# Patient Record
Sex: Female | Born: 1950 | Race: White | Hispanic: No | Marital: Married | Smoking: Never smoker
Health system: Southern US, Community
[De-identification: ages and names within clinical notes are randomized; demographics above are authoritative.]

## PROBLEM LIST (undated history)

## (undated) DIAGNOSIS — D510 Vitamin B12 deficiency anemia due to intrinsic factor deficiency: Secondary | ICD-10-CM

## (undated) DIAGNOSIS — G4730 Sleep apnea, unspecified: Secondary | ICD-10-CM

## (undated) DIAGNOSIS — Z9889 Other specified postprocedural states: Secondary | ICD-10-CM

## (undated) DIAGNOSIS — I00 Rheumatic fever without heart involvement: Secondary | ICD-10-CM

## (undated) DIAGNOSIS — E039 Hypothyroidism, unspecified: Secondary | ICD-10-CM

## (undated) DIAGNOSIS — C44712 Basal cell carcinoma of skin of right lower limb, including hip: Secondary | ICD-10-CM

## (undated) DIAGNOSIS — N8501 Benign endometrial hyperplasia: Secondary | ICD-10-CM

## (undated) DIAGNOSIS — I341 Nonrheumatic mitral (valve) prolapse: Secondary | ICD-10-CM

## (undated) DIAGNOSIS — I340 Nonrheumatic mitral (valve) insufficiency: Secondary | ICD-10-CM

## (undated) DIAGNOSIS — K589 Irritable bowel syndrome without diarrhea: Secondary | ICD-10-CM

## (undated) DIAGNOSIS — N6019 Diffuse cystic mastopathy of unspecified breast: Secondary | ICD-10-CM

## (undated) DIAGNOSIS — R0600 Dyspnea, unspecified: Secondary | ICD-10-CM

## (undated) HISTORY — DX: Vitamin B12 deficiency anemia due to intrinsic factor deficiency: D51.0

## (undated) HISTORY — DX: Hypothyroidism, unspecified: E03.9

## (undated) HISTORY — DX: Nonrheumatic mitral (valve) insufficiency: I34.0

## (undated) HISTORY — DX: Benign endometrial hyperplasia: N85.01

## (undated) HISTORY — PX: APPENDECTOMY: SHX54

## (undated) HISTORY — DX: Basal cell carcinoma of skin of right lower limb, including hip: C44.712

## (undated) HISTORY — PX: TONSILLECTOMY AND ADENOIDECTOMY: SUR1326

## (undated) HISTORY — DX: Irritable bowel syndrome, unspecified: K58.9

## (undated) HISTORY — DX: Nonrheumatic mitral (valve) prolapse: I34.1

## (undated) HISTORY — DX: Diffuse cystic mastopathy of unspecified breast: N60.19

---

## 1997-12-09 ENCOUNTER — Ambulatory Visit (HOSPITAL_COMMUNITY): Admission: RE | Admit: 1997-12-09 | Discharge: 1997-12-09 | Payer: Self-pay | Admitting: *Deleted

## 1997-12-16 ENCOUNTER — Ambulatory Visit (HOSPITAL_COMMUNITY): Admission: RE | Admit: 1997-12-16 | Discharge: 1997-12-16 | Payer: Self-pay | Admitting: *Deleted

## 1998-08-05 ENCOUNTER — Ambulatory Visit (HOSPITAL_COMMUNITY): Admission: RE | Admit: 1998-08-05 | Discharge: 1998-08-05 | Payer: Self-pay | Admitting: *Deleted

## 1998-08-05 ENCOUNTER — Encounter: Payer: Self-pay | Admitting: *Deleted

## 1998-10-08 ENCOUNTER — Other Ambulatory Visit: Admission: RE | Admit: 1998-10-08 | Discharge: 1998-10-08 | Payer: Self-pay | Admitting: *Deleted

## 1998-10-09 ENCOUNTER — Other Ambulatory Visit: Admission: RE | Admit: 1998-10-09 | Discharge: 1998-10-09 | Payer: Self-pay | Admitting: *Deleted

## 2000-01-25 ENCOUNTER — Other Ambulatory Visit: Admission: RE | Admit: 2000-01-25 | Discharge: 2000-01-25 | Payer: Self-pay | Admitting: Obstetrics and Gynecology

## 2000-09-29 ENCOUNTER — Other Ambulatory Visit: Admission: RE | Admit: 2000-09-29 | Discharge: 2000-09-29 | Payer: Self-pay | Admitting: Obstetrics and Gynecology

## 2000-10-21 ENCOUNTER — Encounter: Admission: RE | Admit: 2000-10-21 | Discharge: 2000-10-21 | Payer: Self-pay | Admitting: Obstetrics and Gynecology

## 2000-10-21 ENCOUNTER — Encounter: Payer: Self-pay | Admitting: Obstetrics and Gynecology

## 2001-01-24 ENCOUNTER — Other Ambulatory Visit: Admission: RE | Admit: 2001-01-24 | Discharge: 2001-01-24 | Payer: Self-pay | Admitting: Obstetrics and Gynecology

## 2001-04-07 ENCOUNTER — Ambulatory Visit (HOSPITAL_COMMUNITY): Admission: RE | Admit: 2001-04-07 | Discharge: 2001-04-07 | Payer: Self-pay | Admitting: Gastroenterology

## 2001-04-07 ENCOUNTER — Encounter: Payer: Self-pay | Admitting: Gastroenterology

## 2001-04-14 ENCOUNTER — Encounter: Admission: RE | Admit: 2001-04-14 | Discharge: 2001-04-14 | Payer: Self-pay | Admitting: Gastroenterology

## 2001-04-14 ENCOUNTER — Encounter: Payer: Self-pay | Admitting: Gastroenterology

## 2001-04-22 ENCOUNTER — Emergency Department (HOSPITAL_COMMUNITY): Admission: EM | Admit: 2001-04-22 | Discharge: 2001-04-22 | Payer: Self-pay | Admitting: Emergency Medicine

## 2001-05-10 ENCOUNTER — Ambulatory Visit (HOSPITAL_COMMUNITY): Admission: RE | Admit: 2001-05-10 | Discharge: 2001-05-10 | Payer: Self-pay | Admitting: Gastroenterology

## 2002-02-14 ENCOUNTER — Other Ambulatory Visit: Admission: RE | Admit: 2002-02-14 | Discharge: 2002-02-14 | Payer: Self-pay | Admitting: Obstetrics and Gynecology

## 2002-02-27 ENCOUNTER — Other Ambulatory Visit: Admission: RE | Admit: 2002-02-27 | Discharge: 2002-02-27 | Payer: Self-pay | Admitting: Obstetrics and Gynecology

## 2002-03-08 ENCOUNTER — Ambulatory Visit (HOSPITAL_COMMUNITY): Admission: RE | Admit: 2002-03-08 | Discharge: 2002-03-08 | Payer: Self-pay | Admitting: Gastroenterology

## 2002-03-17 ENCOUNTER — Inpatient Hospital Stay (HOSPITAL_COMMUNITY): Admission: EM | Admit: 2002-03-17 | Discharge: 2002-03-18 | Payer: Self-pay | Admitting: Emergency Medicine

## 2002-03-17 ENCOUNTER — Encounter (INDEPENDENT_AMBULATORY_CARE_PROVIDER_SITE_OTHER): Payer: Self-pay | Admitting: Specialist

## 2002-03-18 HISTORY — PX: LAPAROSCOPIC CHOLECYSTECTOMY: SUR755

## 2002-06-20 ENCOUNTER — Other Ambulatory Visit: Admission: RE | Admit: 2002-06-20 | Discharge: 2002-06-20 | Payer: Self-pay | Admitting: Obstetrics and Gynecology

## 2003-03-06 ENCOUNTER — Other Ambulatory Visit: Admission: RE | Admit: 2003-03-06 | Discharge: 2003-03-06 | Payer: Self-pay | Admitting: Obstetrics and Gynecology

## 2003-06-26 ENCOUNTER — Emergency Department (HOSPITAL_COMMUNITY): Admission: EM | Admit: 2003-06-26 | Discharge: 2003-06-26 | Payer: Self-pay | Admitting: Emergency Medicine

## 2003-06-26 ENCOUNTER — Encounter: Payer: Self-pay | Admitting: Emergency Medicine

## 2004-02-18 ENCOUNTER — Encounter: Admission: RE | Admit: 2004-02-18 | Discharge: 2004-02-18 | Payer: Self-pay | Admitting: Obstetrics and Gynecology

## 2004-03-11 ENCOUNTER — Other Ambulatory Visit: Admission: RE | Admit: 2004-03-11 | Discharge: 2004-03-11 | Payer: Self-pay | Admitting: Obstetrics and Gynecology

## 2005-03-17 ENCOUNTER — Other Ambulatory Visit: Admission: RE | Admit: 2005-03-17 | Discharge: 2005-03-17 | Payer: Self-pay | Admitting: Obstetrics and Gynecology

## 2005-03-24 ENCOUNTER — Encounter: Admission: RE | Admit: 2005-03-24 | Discharge: 2005-03-24 | Payer: Self-pay | Admitting: Obstetrics and Gynecology

## 2006-04-18 ENCOUNTER — Other Ambulatory Visit: Admission: RE | Admit: 2006-04-18 | Discharge: 2006-04-18 | Payer: Self-pay | Admitting: Obstetrics and Gynecology

## 2006-05-20 ENCOUNTER — Encounter: Admission: RE | Admit: 2006-05-20 | Discharge: 2006-05-20 | Payer: Self-pay | Admitting: Obstetrics and Gynecology

## 2007-08-02 ENCOUNTER — Other Ambulatory Visit: Admission: RE | Admit: 2007-08-02 | Discharge: 2007-08-02 | Payer: Self-pay | Admitting: Obstetrics & Gynecology

## 2007-08-04 ENCOUNTER — Encounter: Admission: RE | Admit: 2007-08-04 | Discharge: 2007-08-04 | Payer: Self-pay | Admitting: Obstetrics and Gynecology

## 2008-06-20 ENCOUNTER — Encounter: Admission: RE | Admit: 2008-06-20 | Discharge: 2008-06-20 | Payer: Self-pay | Admitting: Obstetrics and Gynecology

## 2008-08-06 ENCOUNTER — Other Ambulatory Visit: Admission: RE | Admit: 2008-08-06 | Discharge: 2008-08-06 | Payer: Self-pay | Admitting: Obstetrics and Gynecology

## 2011-03-05 NOTE — Procedures (Signed)
. [facility]  Patient:    Tammy Montoya, HOLECEK Visit Number: [id number] MRN: [id number]          Service Type: END Location: ENDO Attending Physician:  Charna Elizabeth Dictated by:   Anselmo Rod, M.D. Proc. Date: 03/08/02 Admit Date:  03/08/2002 Discharge Date: 03/08/2002   CC:         Aram Beecham P. Ashley Royalty, M.D.  Abigail Miyamoto, M.D.   Procedure Report  DATE OF BIRTH:  Jul 08, 1951  REFERRING PHYSICIAN:  Edwena Felty. Ashley Royalty, M.D.  PROCEDURE PERFORMED:  Esophagogastroduodenoscopy.  ENDOSCOPIST:  Anselmo Rod, M.D.  INSTRUMENT USED:  Olympus video panendoscope.  INDICATIONS FOR PROCEDURE:  Ongoing abdominal pain mostly in the epigastric and right upper quadrant.  Rule out peptic ulcer disease, esophagitis, gastritis, etc.  PREPROCEDURE PREPARATION:  Informed consent was procured from the patient. The patient was fasted for eight hours prior to the procedure.  PREPROCEDURE PHYSICAL:  The patient had stable vital signs.  Neck supple. Chest clear to auscultation.  S1, S2 regular.  Abdomen soft with normal abdominal bowel sounds.  DESCRIPTION OF PROCEDURE:  The patient was placed in left lateral decubitus position and sedated with 50 mg of Demerol and 5 mg of Versed intravenously. Once the patient was adequately sedated and maintained on low-flow oxygen and continuous cardiac monitoring, the Olympus video panendoscope was advanced through the mouthpiece, over the tongue, into the esophagus under direct vision.  Except for a small hiatal hernia, no other abnormalities were noted. There were no ulcers, erosions, masses or polyps present.  IMPRESSION:  Normal esophagogastroduodenoscopy except for a small hiatal hernia.  RECOMMENDATION: 1. Continue proton pump inhibitors. 2. Follow antireflux measures. 3. Avoid all nonsteroidals including aspirin. 4. Surgical evaluation with Dr. Abigail Miyamoto for possible laparoscopic  cholecystectomy. Dictated by:   Anselmo Rod, M.D. Attending Physician:  Charna Elizabeth DD:  03/11/02 TD:  03/13/02 Job: [id number]

## 2011-03-05 NOTE — Procedures (Signed)
[place]  Patient:    Tammy Montoya, Tammy Montoya                      MRN: [id number] Proc. Date: 05/10/01 Adm. Date:  [id number] Disc. Date: [id number] Attending:  Benny Lennert CC:         Edwena Felty. Ashley Royalty, M.D.   Procedure Report  DATE OF BIRTH:  [birth year]  REFERRING PHYSICIAN:  Edwena Felty. Ashley Royalty, M.D.  PROCEDURE PERFORMED:  Colonoscopy.  ENDOSCOPIST:  Anselmo Rod, M.D.  INSTRUMENT USED:  Olympus pediatric video colonoscope.  INDICATIONS FOR PROCEDURE:  Guaiac positive stool and abdominal pain in a 60 year old white female.  Rule out colonic polyps, masses, hemorrhoids, etc.  PREPROCEDURE PREPARATION:  Informed consent was procured from the patient. The patient was fasted for eight hours prior to the procedure and prepped with a bottle of magnesium citrate and a gallon of NuLytely the night prior to the procedure.  PREPROCEDURE PHYSICAL:  The patient had stable vital signs.  Neck supple. Chest clear to auscultation.  S1, S2 regular.  Abdomen soft with normal bowel sounds.  DESCRIPTION OF PROCEDURE:  The patient was placed in the left lateral decubitus position and sedated with 50 mg of Demerol and 5 mg of Versed intravenously.  Once the patient was adequately sedated and maintained on low-flow oxygen and continuous cardiac monitoring, the Olympus video colonoscope was advanced from the rectum to the cecum with slight difficulty secondary to a large amount of residual stool in the colon.  No masses, polyps, erosions, ulcerations or diverticula were seen.  IMPRESSION:  Normal colonoscopy except for some residual stool in the colon. Small lesions could have been missed.  RECOMMENDATIONS:  Repeat guaiac testing will be done on an outpatient basis and further recommendations made thereafter.DD:  05/10/01 TD:  05/10/01 Job: [id number]

## 2011-03-05 NOTE — H&P (Signed)
[facility]  Patient:    Tammy Montoya, Tammy Montoya Visit Number: [id number] MRN: [id number]          Service Type: END Location: ENDO Attending Physician:  Charna Elizabeth Dictated by:   Currie Paris, M.D. Admit Date:  03/08/2002 Discharge Date: 03/08/2002   CC:         Anselmo Rod, M.D.   History and Physical  VISIT # [id number]  CHIEF COMPLAINT:  Abdominal pain.  HISTORY OF PRESENT ILLNESS:  The patient is a 60 year old lady who has been worked up over the last year by Dr. Elsie Amis for intermittent problems with right upper quadrant pain and nausea.  She has actually had ongoing symptoms for about five years.  No definite etiology of her pain has been found, but it has been thought that it was biliary despite a negative ultrasound and normal emptying scan.  She had a normal endoscopy about 10 days ago and saw Dr. Carman Ching of our office about five days ago and was scheduled for elective laparoscopic cholecystectomy on March 29, 2002.  However, since she was seen in our office on Tuesday, she has had increasing problems with nausea such that for the last 24-36 hours she has not been able to keep anything on her stomach and has increasing right upper quadrant soreness.  She has not had any fever, chills, bowel habit changes, etc.  Because of her symptoms she has presented to the emergency room for further evaluation.  PAST MEDICAL HISTORY:  Generally good health. 1. Mitral valve prolapse. 2. Some history of GERD.  ALLERGIES:  She apparently is allergic to CT CONTRAST.  MEDICATIONS:  She has been taking Synthroid for hypothyroidism as well as Nexium for her GI symptoms, the Nexium of which has not helped.  REVIEW OF SYSTEMS:  Basically unremarkable except for a history of endometriosis in the past, but nothing to suggest any symptoms from that.  PAST SURGICAL HISTORY: 1. Appendectomy at age 60. 2. About 15 years ago, laparoscopy for  fertility surgery which worked, as they    have a single child, age 7.  PHYSICAL EXAMINATION:  GENERAL:  Generally healthy-appearing female.  VITAL SIGNS:  Noted in the chart and unremarkable.  HEENT:  Head normocephalic.  Eyes nonicteric.  Pupils round and regular.  NECK:  Supple.  No masses or thyromegaly.  LUNGS:  Clear to auscultation.  HEART:  Regular.  No murmurs, rubs, or gallops.  ABDOMEN:  Generally soft, but she has some definite tenderness in the right upper quadrant.  She has a right lower quadrant scar that is well-healed, as well as an umbilical scar.  There is no guarding, no rebound.  Bowel sounds are normal.  EXTREMITIES:  No cyanosis or edema.  LABORATORY DATA:  CBC, CMET, UA, amylase, and lipase are normal.  IMPRESSION:  Biliary symptoms in a patient without clear-cut gallstones.  RECOMMENDATIONS:  Discussed at length with the patient her symptoms.  She has had a pretty thorough workup and was already committed to proceeding to cholecystectomy.  She understands from Dr. Magnus Ivan, and I reinforced, that given the negative workup we cannot be certain at all that her symptoms will resolve with cholecystectomy, and she does understand there are risks to the procedure as well as not getting improvement from her symptoms.  I did tell her I thought there was a 60-80% chance that she would improve, but that did leave a significant rate of nonimprovement.  I offered her alternatives to  proceeded with surgery this weekend versus being treated medically with some antinausea medications and liquids and try to wait until next week or move her elective surgery up a little bit.  The patient feels that since she has been sick for the last two days, not able to keep anything down, and she is already committed to proceeding to a cholecystectomy, she would like to go ahead and proceed.  I will, therefore, admit her, place her on IV fluids tonight, and try to get her on  the schedule for Sunday, March 18, 2002. Dictated by:   Currie Paris, M.D. Attending Physician:  Charna Elizabeth DD:  03/17/02 TD:  03/18/02 Job: [id number]

## 2011-03-05 NOTE — Op Note (Signed)
[facility]  Patient:    Tammy Montoya, Tammy Montoya Visit Number: [id number] MRN: [id number]          Service Type: MED Location: 4N [id number] Attending Physician:  Charlton Haws Dictated by:   Currie Paris, M.D. Proc. Date: 03/18/02 Admit Date:  03/17/2002                             Operative Report  VISIT NUMBER:  [id number]  PREOPERATIVE DIAGNOSIS:  Chronic cholecystitis.  POSTOPERATIVE DIAGNOSIS:  Chronic cholecystitis.  OPERATION:  Laparoscopic cholecystectomy.  SURGEON:  Currie Paris, M.D.  ASSISTANT:  Thornton Park. Daphine Deutscher, M.D.  ANESTHESIA:  General endotracheal.  CLINICAL HISTORY:  This patient is a 60 year old woman, who has been having intermittent problems with right upper quadrant pain.  Work-up had been negative, but it was thought to be biliary, and she was actually scheduled for an elective laparoscopic cholecystectomy about two weeks from now but presented to the emergency room yesterday with increasing abdominal pain, nausea, had not really been able to keep anything down on her stomach for a couple of days, and we elected at this point, after a full discussion of alternatives and possibilities of continuing to have symptoms, to proceed to surgery.  DESCRIPTION OF PROCEDURE:  The patient was seen in the holding area and had no further questions.  She was taken to the operating room.  After satisfactory general endotracheal anesthesia had been obtained, the abdomen was prepped and draped.  I used 0.25% Marcaine for each incision.  The umbilical incision was made first, the fascia opened, and the peritoneal cavity entered under direct vision.  A pursestring was placed and the Hasson introduced and the abdomen insufflated to 15.  With the camera in place, I perused the abdomen and saw no gross abnormalities, but I was not able to see the pelvic organs well.  The gallbladder was nondistended but whitish looking.  The  liver looked normal. There were omental adhesions all the way of the entire length of the gallbladder, indicating some chronic disease here.  The gallbladder was retracted over the liver, and these adhesions were taken down using cautery and occasionally a clip on some of the omental vessels until we got down to the neck of the gallbladder and around the area of the triangle of Calot.  The cystic duct was identified as was the cystic artery, and they were dissected out and a window opened so I could well into the triangle of Calot and make sure the anatomy was properly identified.  The cystic duct was clipped with three clips on the stay side and one on the gallbladder side and divided, and the cystic artery was divided with two clips on the stay side and one on the go side.  The gallbladder was then removed from below to above with coagulation and current of the cautery.  Just prior to disconnecting it from the bed of the liver, we irrigated and made sure everything was dry.  The gallbladder was brought out the umbilical port, then reinsufflated and a final check for hemostasis made and, again, everything appeared to be dry.  The lateral ports were then removed, and there was no bleeding.  The umbilical port was removed and the pursestring tied down.  The abdomen was deflated through the epigastric port.  The skin was closed with 4-0 Monocryl subcuticular plus Steri-Strips.  The patient tolerated the procedure well.  There were no operative complications.  All counts were correct. Dictated by:   Currie Paris, M.D. Attending Physician:  Charlton Haws DD:  03/18/02 TD:  03/19/02 Job: [phone]

## 2013-09-05 ENCOUNTER — Other Ambulatory Visit: Payer: Self-pay | Admitting: Chiropractic Medicine

## 2013-09-05 ENCOUNTER — Ambulatory Visit
Admission: RE | Admit: 2013-09-05 | Discharge: 2013-09-05 | Disposition: A | Discharge status: Home / Self Care | Payer: BC Managed Care – PPO | Source: Ambulatory Visit | Attending: Chiropractic Medicine | Admitting: Chiropractic Medicine

## 2013-09-05 DIAGNOSIS — M2170 Unequal limb length (acquired), unspecified site: Secondary | ICD-10-CM

## 2013-09-05 DIAGNOSIS — M5412 Radiculopathy, cervical region: Secondary | ICD-10-CM

## 2013-09-05 DIAGNOSIS — M5416 Radiculopathy, lumbar region: Secondary | ICD-10-CM

## 2013-09-05 DIAGNOSIS — M545 Low back pain: Secondary | ICD-10-CM

## 2013-11-20 ENCOUNTER — Encounter: Payer: Self-pay | Admitting: Obstetrics and Gynecology

## 2013-11-23 ENCOUNTER — Encounter: Payer: Self-pay | Admitting: Gynecology

## 2013-11-23 ENCOUNTER — Ambulatory Visit (INDEPENDENT_AMBULATORY_CARE_PROVIDER_SITE_OTHER): Payer: BC Managed Care – PPO | Admitting: Gynecology

## 2013-11-23 ENCOUNTER — Ambulatory Visit: Payer: Self-pay | Admitting: Obstetrics and Gynecology

## 2013-11-23 VITALS — BP 96/71 | HR 70 | Resp 18 | Ht 69.0 in | Wt 149.0 lb

## 2013-11-23 DIAGNOSIS — N6320 Unspecified lump in the left breast, unspecified quadrant: Secondary | ICD-10-CM

## 2013-11-23 DIAGNOSIS — Z79890 Hormone replacement therapy (postmenopausal): Secondary | ICD-10-CM

## 2013-11-23 DIAGNOSIS — N6019 Diffuse cystic mastopathy of unspecified breast: Secondary | ICD-10-CM

## 2013-11-23 DIAGNOSIS — Z01419 Encounter for gynecological examination (general) (routine) without abnormal findings: Secondary | ICD-10-CM

## 2013-11-23 DIAGNOSIS — N63 Unspecified lump in unspecified breast: Secondary | ICD-10-CM

## 2013-11-23 DIAGNOSIS — D510 Vitamin B12 deficiency anemia due to intrinsic factor deficiency: Secondary | ICD-10-CM | POA: Insufficient documentation

## 2013-11-23 MED ORDER — ESTRADIOL 0.05 MG/24HR TD PTWK: 0.0500 mg | MEDICATED_PATCH | TRANSDERMAL | Status: DC

## 2013-11-23 MED ORDER — PROGESTERONE MICRONIZED 200 MG PO CAPS: 200.0000 mg | ORAL_CAPSULE | Freq: Every day | ORAL | Status: DC

## 2013-11-23 NOTE — Addendum Note (Signed)
Addended by: Michele Mcalpine on: 11/23/2013 04:26 PM   Modules accepted: Orders

## 2013-11-23 NOTE — Patient Instructions (Signed)

## 2013-11-23 NOTE — Progress Notes (Signed)
63 y.o. Married Caucasian female   [id number] here for annual exam. Pt reports menses are absent. She does not report hot flashes, does not have night sweats, does not have vaginal dryness.  She is not using lubricants.  She does not report post-menopasual bleeding.  Pt is happy with HRT and would like to continue was started for both bleeding and mood swings.  Pt states she never had hot flashes.  Patient's last menstrual period was 09/17/2012.          Sexually active: yes  The current method of family planning is post menopausal status.    Exercising: no  The patient does not participate in regular exercise at present. Last pap: 11/17/11 NEG HR HPV Abnormal PAP: No Mammogram: 06/20/2008 ; 2013  BSE: has been 3 months Colonoscopy: 07/2012 Polyps f/u in 10 years  DEXA: 03/2005 Alcohol: no Tobacco: no  Labs: [place] Fulbright,MD ; Urine:   Health Maintenance  Topic Date Due  . Mammogram  06/20/2010  . Zostavax  07/25/2011  . Influenza Vaccine  05/18/2013  . Pap Smear  11/16/2014  . Tetanus/tdap  02/15/2017  . Colonoscopy  07/18/2022    Family History  Problem Relation Age of Onset  . Osteoporosis Mother   . Diabetes Father   . Heart attack Father   . Diabetes Brother   . Diabetes Paternal Grandmother     There are no active problems to display for this patient.   Past Medical History  Diagnosis Date  . Hypothyroid   . IBS (irritable bowel syndrome)   . Fibrocystic disease of breast   . Basal cell carcinoma of right lower leg   . MVP (mitral valve prolapse)     Murmur  . Simple endometrial hyperplasia without atypia     Past Surgical History  Procedure Laterality Date  . Appendectomy    . Tonsillectomy and adenoidectomy    . Laparoscopic cholecystectomy  03/2002    Allergies: Review of patient's allergies indicates no known allergies.  Current Outpatient Prescriptions  Medication Sig Dispense Refill  . Ascorbic Acid (VITAMIN C PO) Take by mouth.      . Calcium  Citrate (CITRACAL PO) Take by mouth.      . Cholecalciferol (VITAMIN D) 2000 UNITS CAPS Take by mouth.      . estazolam (PROSOM) 2 MG tablet Take 2 mg by mouth at bedtime.      Marland Kitchen estradiol (CLIMARA - DOSED IN MG/24 HR) 0.05 mg/24hr patch Place 0.05 mg onto the skin once a week.      . levothyroxine (SYNTHROID, LEVOTHROID) 137 MCG tablet Take 137 mcg by mouth daily before breakfast.      . loratadine (CLARITIN) 10 MG tablet Take 10 mg by mouth as needed for allergies.      . Multiple Vitamins-Minerals (MULTIVITAL PO) Take by mouth.      . Naproxen Sodium (ALEVE PO) Take by mouth.      . progesterone (PROMETRIUM) 200 MG capsule Take 200 mg by mouth daily.      . TRAMADOL HCL PO Take by mouth.      Marland Kitchen VITAMIN E PO Take by mouth.       No current facility-administered medications for this visit.    ROS: Pertinent items are noted in HPI.  Exam:    BP 96/71  Pulse 70  Resp 18  Ht 5\' 9"  (1.753 m)  Wt 149 lb (67.586 kg)  BMI 21.99 kg/m2  LMP 09/17/2012 Weight change: @WEIGHTCHANGE @  Last 3 height recordings:  Ht Readings from Last 3 Encounters:  11/23/13 5\' 9"  (1.753 m)   General appearance: alert, cooperative and appears stated age Head: Normocephalic, without obvious abnormality, atraumatic Neck: no adenopathy, no carotid bruit, no JVD, supple, symmetrical, trachea midline and thyroid not enlarged, symmetric, no tenderness/mass/nodules Lungs: clear to auscultation bilaterally Breasts: normal appearance, no masses or tenderness, fibrocystic, area of tenderness closer to 2 o'clock left Heart: regular rate and rhythm, S1, S2 normal, no murmur, click, rub or gallop Abdomen: soft, non-tender; bowel sounds normal; no masses,  no organomegaly Extremities: extremities normal, atraumatic, no cyanosis or edema Skin: Skin color, texture, turgor normal. No rashes or lesions, senile keratosis noted on left breast will f/u, has appt with PCP 2w Lymph nodes: Cervical, supraclavicular, and axillary  nodes normal. no inguinal nodes palpated Neurologic: Grossly normal   Pelvic: External genitalia:  no lesions              Urethra: normal appearing urethra with no masses, tenderness or lesions              Bartholins and Skenes: normal                 Vagina: atrophic              Cervix: normal appearance              Pap taken: no        Bimanual Exam:  Uterus:  uterus is normal size, shape, consistency and nontender                                      Adnexa:    no masses                                      Rectovaginal: Confirms                                      Anus:  normal sphincter tone, no lesions  A: well woman no contraindication to continue hormonal therapy     P: mammogram annual, diagnostic mammo on left pap smear not done Pt to consider stopping HRT and transitioning to effexor-will get back to [place] counseled on breast self exam, mammography screening, osteoporosis, adequate intake of calcium and vitamin D, diet and exercise return annually or prn Discussed PAP guideline changes, importance of weight bearing exercises, calcium, vit D and balanced diet.  An After Visit Summary was printed and given to the patient.

## 2013-11-23 NOTE — Progress Notes (Addendum)
Bilateral Diagnostic Mammogram and L Breast U/S scheduled at [facility] for 11/30/13 at [place].   Patient agreeable to time/date/location. Placed in [place] hold.

## 2013-11-30 ENCOUNTER — Other Ambulatory Visit: Payer: Self-pay | Admitting: Gynecology

## 2013-11-30 ENCOUNTER — Ambulatory Visit
Admission: RE | Admit: 2013-11-30 | Discharge: 2013-11-30 | Disposition: A | Discharge status: Home / Self Care | Payer: Self-pay | Source: Ambulatory Visit | Attending: Gynecology | Admitting: Gynecology

## 2013-11-30 ENCOUNTER — Ambulatory Visit
Admission: RE | Admit: 2013-11-30 | Discharge: 2013-11-30 | Disposition: A | Discharge status: Home / Self Care | Payer: BC Managed Care – PPO | Source: Ambulatory Visit | Attending: Gynecology | Admitting: Gynecology

## 2013-11-30 DIAGNOSIS — N6320 Unspecified lump in the left breast, unspecified quadrant: Secondary | ICD-10-CM

## 2013-11-30 DIAGNOSIS — N6019 Diffuse cystic mastopathy of unspecified breast: Secondary | ICD-10-CM

## 2013-11-30 DIAGNOSIS — N6011 Diffuse cystic mastopathy of right breast: Secondary | ICD-10-CM

## 2013-11-30 DIAGNOSIS — N6012 Diffuse cystic mastopathy of left breast: Principal | ICD-10-CM

## 2013-12-03 ENCOUNTER — Telehealth: Payer: Self-pay | Admitting: Emergency Medicine

## 2013-12-03 NOTE — Telephone Encounter (Signed)
Message copied by Michele Mcalpine on Mon Dec 03, 2013 10:23 AM ------      Message from: Elveria Rising      Created: Fri Nov 30, 2013 12:18 PM       Recall exam in 83m to recheck ------

## 2013-12-03 NOTE — Telephone Encounter (Signed)
Message left to return call to Mackinzie Vuncannon at [phone].    

## 2013-12-04 NOTE — Telephone Encounter (Signed)
Message copied by Jaymes Graff on Tue Dec 04, 2013  1:40 PM ------      Message from: Elveria Rising      Created: Fri Nov 30, 2013 12:17 PM       Mammogram and u/s show what looks like many breast cysts, she may want to consider stopping the hrt and trying the effexor like we discussed at her annual ------

## 2013-12-04 NOTE — Telephone Encounter (Signed)
Call to patient to review MMG result, LMTCB.

## 2013-12-19 NOTE — Telephone Encounter (Signed)
Spoke with patient. Patient was given message from Dr. Charlies Constable. Patient states she will not stop taking her HRT as she has a history of breast cysts and feels that they have not changed based on HRT.  Patient states "that is documented in my chart".  Patient then went on to say "it is really disturbing that Dr. Charlies Constable did not fill any of my prescriptions" I attempted to advise patient that it appeared that Dr. Charlies Constable has indeed placed her prescription refills and patient disconnected the line after stating that she will see her pcp for refills.   Routing to provider for final review.  Will close encounter.

## 2013-12-19 NOTE — Telephone Encounter (Signed)
This was the pt we discussed

## 2014-01-08 NOTE — Telephone Encounter (Signed)
Follow up call to patient, LMTCB, VM confirms phone number, no details left.

## 2014-01-21 NOTE — Telephone Encounter (Signed)
yes

## 2014-01-21 NOTE — Telephone Encounter (Signed)
No patient response, ok to close encounter?  

## 2014-07-24 ENCOUNTER — Other Ambulatory Visit: Payer: Self-pay | Admitting: Gynecology

## 2014-07-24 DIAGNOSIS — Z79890 Hormone replacement therapy (postmenopausal): Secondary | ICD-10-CM

## 2014-07-24 NOTE — Telephone Encounter (Signed)
progesterone (PROMETRIUM) 200 MG capsule and  estradiol (CLIMARA - DOSED IN MG/24 HR) 0.05 mg/24hr patch  Patient request refills, pharmacy on file.

## 2014-07-24 NOTE — Telephone Encounter (Signed)
Last AEX and refill 11/23/13 #90/0R MMG/US 11/2013 BIRADS2: Benign   Please advise

## 2014-07-28 MED ORDER — PROGESTERONE MICRONIZED 200 MG PO CAPS: 200.0000 mg | ORAL_CAPSULE | Freq: Every day | ORAL | Status: AC

## 2014-08-19 ENCOUNTER — Encounter: Payer: Self-pay | Admitting: Gynecology

## 2014-09-30 DIAGNOSIS — M5441 Lumbago with sciatica, right side: Secondary | ICD-10-CM | POA: Insufficient documentation

## 2014-10-16 ENCOUNTER — Other Ambulatory Visit: Payer: Self-pay | Admitting: *Deleted

## 2014-10-16 DIAGNOSIS — Z79890 Hormone replacement therapy (postmenopausal): Secondary | ICD-10-CM

## 2014-10-16 NOTE — Telephone Encounter (Signed)
Medication refill request: Estradiol Patch 0.05 mg  Last AEX:  11/23/13 with Dr. Charlies Constable Next AEX: No AEX scheduled for 2016 Last MMG (if hormonal medication request):  - 11/30/13 Bilateral breast cysts;  Bi-Rads 2: Benign - Bilateral Breast Ultrasound- Bilateral Breast cysts; no evidence of malignancy in either breast Refill authorized: Please advise   Left Message To Call Back

## 2014-10-21 MED ORDER — ESTRADIOL 0.05 MG/24HR TD PTWK: 0.0500 mg | MEDICATED_PATCH | TRANSDERMAL | Status: AC

## 2014-10-21 NOTE — Telephone Encounter (Signed)
Please have the patient call to schedule her annual exam for February.  I will refill her estrogen for two more months.

## 2014-10-22 NOTE — Telephone Encounter (Signed)
Lm for pt to call back and schedule AEX appt.

## 2014-11-27 ENCOUNTER — Ambulatory Visit: Payer: BC Managed Care – PPO | Admitting: Gynecology

## 2015-02-19 ENCOUNTER — Telehealth: Payer: Self-pay | Admitting: *Deleted

## 2015-02-19 NOTE — Telephone Encounter (Signed)
Called patient. [facility] and schedule [facility].  She states she will check with supervisor and then call back to schedule AEX.  Informed that she needs MMG and AEX ASAP. - pt verbalized understanding.   - Needs AEX ASAP with first available provider. - Front desk notified.

## 2015-02-19 NOTE — Telephone Encounter (Signed)
Recall Notes:  Pt due for breast check in office and screening mammogram.  Pt is also due for AEX.  Pt is taking HRT.  Last MMG:  11/30/13 with bilateral diagnostic ultrasound. IMPRESSION: 1. Bilateral breast cysts.  2. No mammographic evidence of malignancy in either breast.  RECOMMENDATION: Screening mammogram in one year.  Recommendation from Dr. Charlies Constable to follow up in office for breast check in 4 months (03/2014).   Pt overdue for mammogram recall.  Due for screening mammogram and AEX.  Follow up appointment has not been made.  Please call patient to schedule these appointments.  [facility].

## 2015-02-20 NOTE — Telephone Encounter (Signed)
Recall extended to 02/2015. Routing to provider for final review.  Closing encounter.

## 2015-05-27 ENCOUNTER — Other Ambulatory Visit: Payer: Self-pay | Admitting: Obstetrics and Gynecology

## 2015-05-27 NOTE — Telephone Encounter (Addendum)
Medication refill request: Climara 0.05 mg patch Last AEX:  11/23/13 with TL  Next AEX: No AEX scheduled  Last MMG (if hormonal medication request): 11/30/13 bilateral breast cysts, no mammographic evidence of malignancy in either breast  Refill authorized: ?  Called patient she was driving and said she will call us back to schedule AEX.

## 2015-05-30 ENCOUNTER — Telehealth: Payer: Self-pay | Admitting: *Deleted

## 2015-05-30 NOTE — Telephone Encounter (Signed)
OK to write letter and out of MMG recall

## 2015-05-30 NOTE — Telephone Encounter (Signed)
Due for screening mammogram in 11/2014 following abnormal mammogram.  Phone note created 02/19/15 with patient call to schedule exam.  Pt declined to schedule MMG or AEX at that time stating she will make the call on her own.  Recall was extended to allow time for scheduling.  Pt has not scheduled MMG or AEX at this time.  Please advise recall.

## 2015-06-05 ENCOUNTER — Encounter: Payer: Self-pay | Admitting: *Deleted

## 2015-06-05 NOTE — Telephone Encounter (Signed)
Letter created and sent to Dr. Sabra Heck to sign.  Pt removed from current recall.

## 2015-06-05 NOTE — Telephone Encounter (Signed)
Letter signed.  Ok to remove from recall.

## 2015-06-06 NOTE — Telephone Encounter (Signed)
Letter mailed and marked as sent.  Pt removed from current recall.  Closing encounter.

## 2016-07-28 DIAGNOSIS — R059 Cough, unspecified: Secondary | ICD-10-CM | POA: Insufficient documentation

## 2016-07-28 DIAGNOSIS — R05 Cough: Secondary | ICD-10-CM | POA: Insufficient documentation

## 2016-09-07 ENCOUNTER — Other Ambulatory Visit: Payer: Self-pay | Admitting: Family Medicine

## 2016-09-07 DIAGNOSIS — Z1231 Encounter for screening mammogram for malignant neoplasm of breast: Secondary | ICD-10-CM

## 2016-09-08 ENCOUNTER — Other Ambulatory Visit: Payer: Self-pay | Admitting: Family Medicine

## 2016-09-08 DIAGNOSIS — R5381 Other malaise: Secondary | ICD-10-CM

## 2016-09-13 ENCOUNTER — Ambulatory Visit
Admission: RE | Admit: 2016-09-13 | Discharge: 2016-09-13 | Disposition: A | Discharge status: Home / Self Care | Payer: BLUE CROSS/BLUE SHIELD | Source: Ambulatory Visit | Attending: Family Medicine | Admitting: Family Medicine

## 2016-09-13 DIAGNOSIS — Z1231 Encounter for screening mammogram for malignant neoplasm of breast: Secondary | ICD-10-CM

## 2016-09-21 ENCOUNTER — Other Ambulatory Visit: Payer: Self-pay | Admitting: Family Medicine

## 2016-09-21 DIAGNOSIS — E2839 Other primary ovarian failure: Secondary | ICD-10-CM

## 2017-09-01 NOTE — Progress Notes (Deleted)
Referring-Ronald Polite MD Reason for referral-Palpitations  HPI: 66 year old female for evaluation of palpitations at request of Seward Carol MD.  Current Outpatient Medications  Medication Sig Dispense Refill  . Ascorbic Acid (VITAMIN C PO) Take by mouth.    . Calcium Citrate (CITRACAL PO) Take by mouth.    . Cholecalciferol (VITAMIN D) 2000 UNITS CAPS Take by mouth.    . Cyanocobalamin (VITAMIN B-12 IJ) Inject as directed once a week.    . estazolam (PROSOM) 2 MG tablet Take 2 mg by mouth at bedtime.    Marland Kitchen estradiol (CLIMARA - DOSED IN MG/24 HR) 0.05 mg/24hr patch Place 1 patch (0.05 mg total) onto the skin once a week. 4 patch 1  . levothyroxine (SYNTHROID, LEVOTHROID) 137 MCG tablet Take 137 mcg by mouth daily before breakfast.    . loratadine (CLARITIN) 10 MG tablet Take 10 mg by mouth as needed for allergies.    . Multiple Vitamins-Minerals (MULTIVITAL PO) Take by mouth.    . Naproxen Sodium (ALEVE PO) Take by mouth as needed.     . progesterone (PROMETRIUM) 200 MG capsule Take 1 capsule (200 mg total) by mouth daily. 90 capsule 0  . TRAMADOL HCL PO Take by mouth.    Marland Kitchen VITAMIN E PO Take by mouth.     No current facility-administered medications for this visit.     No Known Allergies  Past Medical History:  Diagnosis Date  . Basal cell carcinoma of right lower leg   . Fibrocystic disease of breast   . Hypothyroid   . IBS (irritable bowel syndrome)   . MVP (mitral valve prolapse)    Murmur  . Pernicious anemia   . Simple endometrial hyperplasia without atypia     Past Surgical History:  Procedure Laterality Date  . APPENDECTOMY    . LAPAROSCOPIC CHOLECYSTECTOMY  03/2002  . TONSILLECTOMY AND ADENOIDECTOMY      Social History   Socioeconomic History  . Marital status: Married    Spouse name: Not on file  . Number of children: Not on file  . Years of education: Not on file  . Highest education level: Not on file  Social Needs  . Financial resource strain:  Not on file  . Food insecurity - worry: Not on file  . Food insecurity - inability: Not on file  . Transportation needs - medical: Not on file  . Transportation needs - non-medical: Not on file  Occupational History  . Not on file  Tobacco Use  . Smoking status: Not on file  Substance and Sexual Activity  . Alcohol use: Not on file  . Drug use: Not on file  . Sexual activity: Not on file  Other Topics Concern  . Not on file  Social History Narrative  . Not on file    Family History  Problem Relation Age of Onset  . Osteoporosis Mother   . Diabetes Father   . Heart attack Father   . Diabetes Brother   . Diabetes Paternal Grandmother     ROS: no fevers or chills, productive cough, hemoptysis, dysphasia, odynophagia, melena, hematochezia, dysuria, hematuria, rash, seizure activity, orthopnea, PND, pedal edema, claudication. Remaining systems are negative.  Physical Exam:   Last menstrual period 09/17/2012.  General:  Well developed/well nourished in NAD Skin warm/dry Patient not depressed No peripheral clubbing Back-normal HEENT-normal/normal eyelids Neck supple/normal carotid upstroke bilaterally; no bruits; no JVD; no thyromegaly chest - CTA/ normal expansion CV - RRR/normal S1 and S2; no  murmurs, rubs or gallops;  PMI nondisplaced Abdomen -NT/ND, no HSM, no mass, + bowel sounds, no bruit 2+ femoral pulses, no bruits Ext-no edema, chords, 2+ DP Neuro-grossly nonfocal  ECG - personally reviewed  A/P  1  Kirk Ruths, MD

## 2017-09-02 ENCOUNTER — Encounter: Payer: Self-pay | Admitting: Cardiology

## 2017-09-14 ENCOUNTER — Ambulatory Visit: Payer: BLUE CROSS/BLUE SHIELD | Admitting: Cardiology

## 2017-09-23 NOTE — Progress Notes (Signed)
Silver Huguenin MD Reason for referral-PVCs and MVP  HPI: 66 yo female for evaluation of PVCs and MVP at request of Maudry Mayhew MD. Patient had a sleep study in June. During that time she was noted to have occasional PVCs and cardiology asked to evaluate. Patient has mild dyspnea on exertion but no orthopnea, PND or pedal edema. She occasionally has indigestion relieved with GI cocktail but no exertional chest pain. Occasional brief palpitations described as a skip.  Current Outpatient Medications  Medication Sig Dispense Refill  . Calcium Citrate (CITRACAL PO) Take by mouth.    . Cholecalciferol (VITAMIN D) 2000 UNITS CAPS Take by mouth.    . Cyanocobalamin (VITAMIN B-12 IJ) Inject as directed once a week.    . estazolam (PROSOM) 2 MG tablet Take 2 mg by mouth at bedtime. 0.5 tablet at Bedtime as needed    . estradiol (CLIMARA - DOSED IN MG/24 HR) 0.05 mg/24hr patch Place 1 patch (0.05 mg total) onto the skin once a week. 4 patch 1  . levothyroxine (SYNTHROID, LEVOTHROID) 137 MCG tablet Take 137 mcg by mouth daily before breakfast.    . loratadine (CLARITIN) 10 MG tablet Take 10 mg by mouth as needed for allergies.    . meloxicam (MOBIC) 15 MG tablet Take 15 mg by mouth as needed for pain.    . Multiple Vitamins-Minerals (MULTIVITAL PO) Take by mouth.    . progesterone (PROMETRIUM) 200 MG capsule Take 1 capsule (200 mg total) by mouth daily. 90 capsule 0  . TRAMADOL HCL PO Take by mouth.    . Ascorbic Acid (VITAMIN C PO) Take by mouth.    . Naproxen Sodium (ALEVE PO) Take by mouth as needed.     Marland Kitchen VITAMIN E PO Take by mouth.     No current facility-administered medications for this visit.     No Known Allergies   Past Medical History:  Diagnosis Date  . Basal cell carcinoma of right lower leg   . Fibrocystic disease of breast   . Hypothyroid   . IBS (irritable bowel syndrome)   . MVP (mitral valve prolapse)    Murmur  . Pernicious anemia   . Simple endometrial  hyperplasia without atypia     Past Surgical History:  Procedure Laterality Date  . APPENDECTOMY    . LAPAROSCOPIC CHOLECYSTECTOMY  03/2002  . TONSILLECTOMY AND ADENOIDECTOMY      Social History   Socioeconomic History  . Marital status: Married    Spouse name: Not on file  . Number of children: 2  . Years of education: Not on file  . Highest education level: Not on file  Social Needs  . Financial resource strain: Not on file  . Food insecurity - worry: Not on file  . Food insecurity - inability: Not on file  . Transportation needs - medical: Not on file  . Transportation needs - non-medical: Not on file  Occupational History  . Not on file  Tobacco Use  . Smoking status: Never Smoker  . Smokeless tobacco: Never Used  Substance and Sexual Activity  . Alcohol use: No    Frequency: Never  . Drug use: No  . Sexual activity: Not on file  Other Topics Concern  . Not on file  Social History Narrative  . Not on file    Family History  Problem Relation Age of Onset  . Diabetes Father   . Heart attack Father   . Osteoporosis Mother   .  Diabetes Brother   . Heart failure Brother   . Diabetes Paternal Grandmother     ROS: low back pain but no fevers or chills, productive cough, hemoptysis, dysphasia, odynophagia, melena, hematochezia, dysuria, hematuria, rash, seizure activity, orthopnea, PND, pedal edema, claudication. Remaining systems are negative.  Physical Exam:   Blood pressure 118/78, pulse 61, height 5\' 9"  (1.753 m), weight 154 lb (69.9 kg), last menstrual period 09/17/2012.  General:  Well developed/well nourished in NAD Skin warm/dry Patient not depressed No peripheral clubbing Back-normal HEENT-normal/normal eyelids Neck supple/normal carotid upstroke bilaterally; no bruits; no JVD; no thyromegaly chest - CTA/ normal expansion CV - RRR/normal S1 and S2; no rubs or gallops;  PMI nondisplaced; 3/6 systolic murmur apex. Abdomen -NT/ND, no HSM, no mass, +  bowel sounds, no bruit 2+ femoral pulses, no bruits Ext-no edema, chords, 2+ DP Neuro-grossly nonfocal  ECG - normal sinus rhythm at a rate of 61. No ST changes.personally reviewed  A/P  1 mitral valve prolapse/mitral regurgitation-patient with loud mitral regurgitation murmur on examination and history of mitral valve prolapse. She has mild dyspnea on exertion but is otherwise asymptomatic. Schedule echocardiogram to assess LV function, mitral valve prolapse and severity of MR. Note she had a brother who died of congestive heart failure and cardiomyopathy but this may have been alcohol-related.   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 She has occasional palpitations. I will arrange a Holter monitor to quantify PVC burden. May need beta blocker in the future.  3 chest pain-patient has occasional "indigestion".Relieved with belching. No exertional chest pain. No further ischemia evaluation.  4 palpitations-likely related to PVCs. Not particularly symptomatic. Plan 24-hour Holter to quantitate PVC burden.  Kirk Ruths, MD

## 2017-09-29 ENCOUNTER — Encounter: Payer: Self-pay | Admitting: Cardiology

## 2017-09-29 ENCOUNTER — Ambulatory Visit: Payer: Medicare Other | Admitting: Cardiology

## 2017-09-29 VITALS — BP 118/78 | HR 61 | Ht 69.0 in | Wt 154.0 lb

## 2017-09-29 DIAGNOSIS — R072 Precordial pain: Secondary | ICD-10-CM

## 2017-09-29 DIAGNOSIS — I493 Ventricular premature depolarization: Secondary | ICD-10-CM

## 2017-09-29 DIAGNOSIS — I341 Nonrheumatic mitral (valve) prolapse: Secondary | ICD-10-CM

## 2017-09-29 DIAGNOSIS — R002 Palpitations: Secondary | ICD-10-CM | POA: Diagnosis not present

## 2017-09-29 NOTE — Patient Instructions (Signed)
Medication Instructions:   NO CHANGE  Testing/Procedures:  Your physician has requested that you have an echocardiogram. Echocardiography is a painless test that uses sound waves to create images of your heart. It provides your doctor with information about the size and shape of your heart and how well your heart's chambers and valves are working. This procedure takes approximately one hour. There are no restrictions for this procedure.PLEASE SCHEDULE AT THE [place] LOCATION  Your physician has recommended that you wear a 24 HOUR holter monitor. Holter monitors are medical devices that record the heart's electrical activity. Doctors most often use these monitors to diagnose arrhythmias. Arrhythmias are problems with the speed or rhythm of the heartbeat. The monitor is a small, portable device. You can wear one while you do your normal daily activities. This is usually used to diagnose what is causing palpitations/syncope (passing out).   Follow-Up:  Your physician wants you to follow-up in: [place] will receive a reminder letter in the mail two months in advance. If you don't receive a letter, please call our office to schedule the follow-up appointment.

## 2017-09-29 NOTE — Addendum Note (Signed)
Addended by: Jacqulynn Cadet on: 09/29/2017 03:04 PM   Modules accepted: Orders

## 2017-10-03 ENCOUNTER — Telehealth: Payer: Self-pay | Admitting: Cardiology

## 2017-10-03 ENCOUNTER — Other Ambulatory Visit: Payer: Self-pay | Admitting: *Deleted

## 2017-10-03 DIAGNOSIS — I341 Nonrheumatic mitral (valve) prolapse: Secondary | ICD-10-CM

## 2017-10-03 NOTE — Telephone Encounter (Signed)
Left message for patient.  She has echo scheduled in [place] on January 4.

## 2017-10-21 ENCOUNTER — Ambulatory Visit (HOSPITAL_BASED_OUTPATIENT_CLINIC_OR_DEPARTMENT_OTHER)
Admission: RE | Admit: 2017-10-21 | Discharge: 2017-10-21 | Disposition: A | Discharge status: Home / Self Care | Payer: Medicare Other | Source: Ambulatory Visit | Attending: Cardiology | Admitting: Cardiology

## 2017-10-21 DIAGNOSIS — I341 Nonrheumatic mitral (valve) prolapse: Secondary | ICD-10-CM | POA: Insufficient documentation

## 2017-10-21 DIAGNOSIS — I340 Nonrheumatic mitral (valve) insufficiency: Secondary | ICD-10-CM | POA: Diagnosis not present

## 2017-10-21 NOTE — Progress Notes (Signed)
Echocardiogram 2D Echocardiogram has been performed.  Tammy Montoya 10/21/2017, 4:01 PM

## 2017-10-27 ENCOUNTER — Ambulatory Visit (INDEPENDENT_AMBULATORY_CARE_PROVIDER_SITE_OTHER): Payer: Medicare Other

## 2017-10-27 DIAGNOSIS — I493 Ventricular premature depolarization: Secondary | ICD-10-CM | POA: Diagnosis not present

## 2017-10-30 DIAGNOSIS — M159 Polyosteoarthritis, unspecified: Secondary | ICD-10-CM | POA: Insufficient documentation

## 2017-10-30 DIAGNOSIS — E039 Hypothyroidism, unspecified: Secondary | ICD-10-CM | POA: Insufficient documentation

## 2017-10-30 DIAGNOSIS — J3089 Other allergic rhinitis: Secondary | ICD-10-CM

## 2017-10-30 DIAGNOSIS — G4733 Obstructive sleep apnea (adult) (pediatric): Secondary | ICD-10-CM | POA: Insufficient documentation

## 2017-10-30 DIAGNOSIS — J302 Other seasonal allergic rhinitis: Secondary | ICD-10-CM | POA: Insufficient documentation

## 2017-10-30 DIAGNOSIS — G4719 Other hypersomnia: Secondary | ICD-10-CM | POA: Insufficient documentation

## 2017-10-30 DIAGNOSIS — N6029 Fibroadenosis of unspecified breast: Secondary | ICD-10-CM | POA: Insufficient documentation

## 2017-10-31 ENCOUNTER — Telehealth: Payer: Self-pay | Admitting: *Deleted

## 2017-10-31 MED ORDER — METOPROLOL SUCCINATE ER 25 MG PO TB24: 12.5000 mg | ORAL_TABLET | Freq: Every day | ORAL | 6 refills | Status: DC

## 2017-10-31 NOTE — Telephone Encounter (Signed)
Spoke with pt, aware of results. Copy mailed to patient at her request. New script sent to the pharmacy

## 2017-10-31 NOTE — Telephone Encounter (Signed)
-----   Message from Lelon Perla, MD sent at 10/31/2017 10:05 AM EST ----- Add toprol 12.5 mg daily Kirk Ruths

## 2017-11-02 ENCOUNTER — Telehealth: Payer: Self-pay | Admitting: Cardiology

## 2017-11-02 NOTE — Telephone Encounter (Signed)
Left message to call back  

## 2017-11-02 NOTE — Telephone Encounter (Signed)
New message ° °Pt verbalized that she is calling for RN °

## 2017-11-04 ENCOUNTER — Telehealth: Payer: Self-pay | Admitting: Cardiology

## 2017-11-04 NOTE — Telephone Encounter (Signed)
Given holter results would begin toprol as recommended; no need to repeat monitor Tammy Montoya

## 2017-11-04 NOTE — Telephone Encounter (Signed)
New message   Patient husband says they would like to go over test results with Crenshaw please call

## 2017-11-04 NOTE — Telephone Encounter (Signed)
Spoke with pt, Aware of dr crenshaw's recommendations.  °

## 2017-11-04 NOTE — Telephone Encounter (Signed)
Spoke to patient. Patient is very concerned that the holter monitor may have been inaccurate, due to the fact she was "heavily medicated" - decongestant , steroids, other medication for bronchitis. She would like to know if she can redo the monitor. She has not started the Toprol 12. 5mg  qd.patient states she can not tell that many and when she had sleep study -they did not record that many. She also wants to know what she can do ?  RN informed patient she was having symptoms prior to monitor that is the reason the monitor was placed.  Continue with normal activity and cut back or eliminate caffeine. RN informed patient may not cover another monitor so soon if no treatment change. Patient request to ask ? Aware will defer to Dr Stanford Breed.

## 2017-11-04 NOTE — Telephone Encounter (Signed)
Left message for pt husband, will need an office visit to discuss. Will call back Tuesday.

## 2017-11-08 NOTE — Telephone Encounter (Signed)
Spoke with pt, Follow up scheduled to discuss monitor.

## 2017-11-28 NOTE — Progress Notes (Signed)
HPI: FU PVCs and MVP. Patient had a sleep study in June 2018.  She was noted to have PVCs.  Echocardiogram January 2019 showed normal LV systolic function, prolapse of posterior MV leaflet with moderate to severe mitral regurgitation and moderate to severe left atrial enlargement by report (I have reviewed personally and there is severe prolapse of post leaflet with probable severe MR).  Holter monitor January 2019 showed sinus rhythm with PACs, brief PAT, PVCs, rare couplet and nonsustained ventricular tachycardia.  Since last seen patient denies dyspnea, chest pain, palpitations or syncope.  Current Outpatient Medications  Medication Sig Dispense Refill  . Ascorbic Acid (VITAMIN C PO) Take by mouth.    . Calcium Citrate (CITRACAL PO) Take by mouth.    . Cholecalciferol (VITAMIN D) 2000 UNITS CAPS Take by mouth.    . Cyanocobalamin (VITAMIN B-12 IJ) Inject as directed once a week.    . estazolam (PROSOM) 2 MG tablet Take 2 mg by mouth at bedtime. 0.5 tablet at Bedtime as needed    . estradiol (CLIMARA - DOSED IN MG/24 HR) 0.05 mg/24hr patch Place 1 patch (0.05 mg total) onto the skin once a week. 4 patch 1  . levothyroxine (SYNTHROID, LEVOTHROID) 137 MCG tablet Take 137 mcg by mouth daily before breakfast.    . loratadine (CLARITIN) 10 MG tablet Take 10 mg by mouth as needed for allergies.    . meloxicam (MOBIC) 15 MG tablet Take 15 mg by mouth as needed for pain.    . metoprolol succinate (TOPROL XL) 25 MG 24 hr tablet Take 0.5 tablets (12.5 mg total) by mouth daily. 30 tablet 6  . Multiple Vitamins-Minerals (MULTIVITAL PO) Take by mouth.    . Naproxen Sodium (ALEVE PO) Take by mouth as needed.     . progesterone (PROMETRIUM) 200 MG capsule Take 1 capsule (200 mg total) by mouth daily. 90 capsule 0  . TRAMADOL HCL PO Take by mouth.    Marland Kitchen VITAMIN E PO Take by mouth.     No current facility-administered medications for this visit.      Past Medical History:  Diagnosis Date  . Basal  cell carcinoma of right lower leg   . Fibrocystic disease of breast   . Hypothyroid   . IBS (irritable bowel syndrome)   . Mitral regurgitation   . MVP (mitral valve prolapse)    Murmur  . Pernicious anemia   . Simple endometrial hyperplasia without atypia     Past Surgical History:  Procedure Laterality Date  . APPENDECTOMY    . LAPAROSCOPIC CHOLECYSTECTOMY  03/2002  . TONSILLECTOMY AND ADENOIDECTOMY      Social History   Socioeconomic History  . Marital status: Married    Spouse name: Not on file  . Number of children: 2  . Years of education: Not on file  . Highest education level: Not on file  Social Needs  . Financial resource strain: Not on file  . Food insecurity - worry: Not on file  . Food insecurity - inability: Not on file  . Transportation needs - medical: Not on file  . Transportation needs - non-medical: Not on file  Occupational History  . Not on file  Tobacco Use  . Smoking status: Never Smoker  . Smokeless tobacco: Never Used  Substance and Sexual Activity  . Alcohol use: No    Frequency: Never  . Drug use: No  . Sexual activity: Not on file  Other Topics Concern  .  Not on file  Social History Narrative  . Not on file    Family History  Problem Relation Age of Onset  . Diabetes Father   . Heart attack Father   . Osteoporosis Mother   . Diabetes Brother   . Heart failure Brother   . Diabetes Paternal Grandmother     ROS: no fevers or chills, productive cough, hemoptysis, dysphasia, odynophagia, melena, hematochezia, dysuria, hematuria, rash, seizure activity, orthopnea, PND, pedal edema, claudication. Remaining systems are negative.  Physical Exam: Well-developed well-nourished in no acute distress.  Skin is warm and dry.  HEENT is normal.  Neck is supple.  Chest is clear to auscultation with normal expansion.  Cardiovascular exam is regular rate and rhythm. 3/6 systolic murmur apex Abdominal exam nontender or distended. No masses  palpated. Extremities show no edema. neuro grossly intact   A/P  1 mitral valve prolapse/mitral regurgitation-I have personally reviewed the patient's recent echocardiogram.  LV function is normal.  There is severe prolapse of posterior mitral valve leaflet with probable severe eccentric mitral regurgitation.  The left ventricle is not dilated (left ventricular end-systolic dimension of 29 mm).  Patient is not having symptoms.  I discussed options today including continued observation with follow-up echocardiograms and proceeding with mitral valve repair if patient develops symptoms, decreased LV function or left ventricular enlargement.  We also discussed the possibility of proceeding with transesophageal echocardiogram with consideration of early repair.  We have decided to proceed with transesophageal echocardiogram to further assess mitral valve and severity of mitral regurgitation.  We will then have further discussions concerning observation versus early repair.    2 PVCs/nonsustained ventricular tachycardia- I will add Toprol12.5 mg daily.  Follow and advance as needed.  3 palpitations-this is likely related to PVCs/nonsustained ventricular tachycardia based on Holter results.  Add Toprol as outlined.   Kirk Ruths, MD

## 2017-12-08 ENCOUNTER — Encounter: Payer: Self-pay | Admitting: *Deleted

## 2017-12-08 ENCOUNTER — Ambulatory Visit (INDEPENDENT_AMBULATORY_CARE_PROVIDER_SITE_OTHER): Payer: Medicare Other | Admitting: Cardiology

## 2017-12-08 ENCOUNTER — Encounter: Payer: Self-pay | Admitting: Cardiology

## 2017-12-08 VITALS — BP 116/62 | HR 70 | Ht 69.0 in | Wt 156.0 lb

## 2017-12-08 DIAGNOSIS — I341 Nonrheumatic mitral (valve) prolapse: Secondary | ICD-10-CM

## 2017-12-08 DIAGNOSIS — R002 Palpitations: Secondary | ICD-10-CM

## 2017-12-08 DIAGNOSIS — I340 Nonrheumatic mitral (valve) insufficiency: Secondary | ICD-10-CM

## 2017-12-08 DIAGNOSIS — I493 Ventricular premature depolarization: Secondary | ICD-10-CM | POA: Diagnosis not present

## 2017-12-08 NOTE — Patient Instructions (Signed)
Medication Instructions:   START METOPROLOL 12.5 MG ONCE DAILY=1/2 OF THE 25 MG TABLET ONCE DAILY  Testing/Procedures:  Your physician has requested that you have a TEE. During a TEE, sound waves are used to create images of your heart. It provides your doctor with information about the size and shape of your heart and how well your heart's chambers and valves are working. In this test, a transducer is attached to the end of a flexible tube that's guided down your throat and into your esophagus (the tube leading from you mouth to your stomach) to get a more detailed image of your heart. You are not awake for the procedure. Please see the instruction sheet given to you today. For further information please visit [email or web address].     Follow-Up:  Your physician recommends that you schedule a follow-up appointment in:  [place]

## 2017-12-08 NOTE — Addendum Note (Signed)
Addended by: Cristopher Estimable on: 12/08/2017 11:47 AM   Modules accepted: Orders, SmartSet

## 2017-12-09 ENCOUNTER — Encounter (HOSPITAL_COMMUNITY)
Admission: RE | Disposition: A | Discharge status: Home / Self Care | Payer: Self-pay | Source: Ambulatory Visit | Attending: Cardiology

## 2017-12-09 ENCOUNTER — Ambulatory Visit (HOSPITAL_BASED_OUTPATIENT_CLINIC_OR_DEPARTMENT_OTHER): Payer: Medicare Other

## 2017-12-09 ENCOUNTER — Ambulatory Visit (HOSPITAL_COMMUNITY)
Admission: RE | Admit: 2017-12-09 | Discharge: 2017-12-09 | Disposition: A | Discharge status: Home / Self Care | Payer: Medicare Other | Source: Ambulatory Visit | Attending: Cardiology | Admitting: Cardiology

## 2017-12-09 ENCOUNTER — Other Ambulatory Visit: Payer: Self-pay

## 2017-12-09 ENCOUNTER — Encounter (HOSPITAL_COMMUNITY): Payer: Self-pay | Admitting: *Deleted

## 2017-12-09 DIAGNOSIS — I493 Ventricular premature depolarization: Secondary | ICD-10-CM | POA: Insufficient documentation

## 2017-12-09 DIAGNOSIS — R002 Palpitations: Secondary | ICD-10-CM | POA: Diagnosis not present

## 2017-12-09 DIAGNOSIS — I472 Ventricular tachycardia: Secondary | ICD-10-CM | POA: Insufficient documentation

## 2017-12-09 DIAGNOSIS — N6019 Diffuse cystic mastopathy of unspecified breast: Secondary | ICD-10-CM | POA: Diagnosis not present

## 2017-12-09 DIAGNOSIS — K589 Irritable bowel syndrome without diarrhea: Secondary | ICD-10-CM | POA: Insufficient documentation

## 2017-12-09 DIAGNOSIS — I340 Nonrheumatic mitral (valve) insufficiency: Secondary | ICD-10-CM

## 2017-12-09 DIAGNOSIS — I341 Nonrheumatic mitral (valve) prolapse: Secondary | ICD-10-CM

## 2017-12-09 DIAGNOSIS — Z85828 Personal history of other malignant neoplasm of skin: Secondary | ICD-10-CM | POA: Insufficient documentation

## 2017-12-09 DIAGNOSIS — D510 Vitamin B12 deficiency anemia due to intrinsic factor deficiency: Secondary | ICD-10-CM | POA: Insufficient documentation

## 2017-12-09 DIAGNOSIS — Z79899 Other long term (current) drug therapy: Secondary | ICD-10-CM | POA: Diagnosis not present

## 2017-12-09 DIAGNOSIS — Z9049 Acquired absence of other specified parts of digestive tract: Secondary | ICD-10-CM | POA: Insufficient documentation

## 2017-12-09 DIAGNOSIS — E039 Hypothyroidism, unspecified: Secondary | ICD-10-CM | POA: Diagnosis not present

## 2017-12-09 DIAGNOSIS — Z833 Family history of diabetes mellitus: Secondary | ICD-10-CM | POA: Insufficient documentation

## 2017-12-09 DIAGNOSIS — Z8249 Family history of ischemic heart disease and other diseases of the circulatory system: Secondary | ICD-10-CM | POA: Diagnosis not present

## 2017-12-09 HISTORY — PX: TEE WITHOUT CARDIOVERSION: SHX5443

## 2017-12-09 SURGERY — ECHOCARDIOGRAM, TRANSESOPHAGEAL
Anesthesia: Moderate Sedation

## 2017-12-09 MED ORDER — MIDAZOLAM HCL 5 MG/ML IJ SOLN
INTRAMUSCULAR | Status: AC
  Filled 2017-12-09: qty 2

## 2017-12-09 MED ORDER — FENTANYL CITRATE (PF) 100 MCG/2ML IJ SOLN
INTRAMUSCULAR | Status: AC
  Filled 2017-12-09: qty 2

## 2017-12-09 MED ORDER — FENTANYL CITRATE (PF) 100 MCG/2ML IJ SOLN
INTRAMUSCULAR | Status: DC | PRN
  Administered 2017-12-09 (×2): 25 ug via INTRAVENOUS

## 2017-12-09 MED ORDER — BUTAMBEN-TETRACAINE-BENZOCAINE 2-2-14 % EX AERO
INHALATION_SPRAY | CUTANEOUS | Status: DC | PRN
  Administered 2017-12-09: 2 via TOPICAL

## 2017-12-09 MED ORDER — SODIUM CHLORIDE 0.9 % IV SOLN
INTRAVENOUS | Status: DC
  Administered 2017-12-09: 10:00:00 via INTRAVENOUS

## 2017-12-09 MED ORDER — MIDAZOLAM HCL 10 MG/2ML IJ SOLN
INTRAMUSCULAR | Status: DC | PRN
  Administered 2017-12-09 (×2): 2 mg via INTRAVENOUS
  Administered 2017-12-09: 1 mg via INTRAVENOUS

## 2017-12-09 NOTE — Interval H&P Note (Signed)
History and Physical Interval Note:  12/09/2017 10:00 AM  Tammy Montoya  has presented today for surgery, with the diagnosis of mitral valve disease  The various methods of treatment have been discussed with the patient and family. After consideration of risks, benefits and other options for treatment, the patient has consented to  Procedure(s): TRANSESOPHAGEAL ECHOCARDIOGRAM (TEE) (N/A) as a surgical intervention .  The patient's history has been reviewed, patient examined, no change in status, stable for surgery.  I have reviewed the patient's chart and labs.  Questions were answered to the patient's satisfaction.     Kirk Ruths

## 2017-12-09 NOTE — Progress Notes (Signed)
  Echocardiogram Echocardiogram Transesophageal has been performed.  Khalen Styer G Illiana Losurdo 12/09/2017, 11:10 AM

## 2017-12-09 NOTE — H&P (Signed)
Lelon Perla, MD  Physician  Cardiology  Progress Notes  Signed  Encounter Date:  12/08/2017       Related encounter: Office Visit from 12/08/2017 in [place]           [] Hide copied text  [] Hover for details        HPI: FU PVCsand MVP.Patient had a sleep study in June 2018.  She was noted to have PVCs.  Echocardiogram January 2019 showed normal LV systolic function, prolapse of posterior MV leaflet with moderate to severe mitral regurgitation and moderate to severe left atrial enlargement by report (I have reviewed personally and there is severe prolapse of post leaflet with probable severe MR).  Holter monitor January 2019 showed sinus rhythm with PACs, brief PAT, PVCs, rare couplet and nonsustained ventricular tachycardia.  Since last seen patient denies dyspnea, chest pain, palpitations or syncope.        Current Outpatient Medications  Medication Sig Dispense Refill  . Ascorbic Acid (VITAMIN C PO) Take by mouth.    . Calcium Citrate (CITRACAL PO) Take by mouth.    . Cholecalciferol (VITAMIN D) 2000 UNITS CAPS Take by mouth.    . Cyanocobalamin (VITAMIN B-12 IJ) Inject as directed once a week.    . estazolam (PROSOM) 2 MG tablet Take 2 mg by mouth at bedtime. 0.5 tablet at Bedtime as needed    . estradiol (CLIMARA - DOSED IN MG/24 HR) 0.05 mg/24hr patch Place 1 patch (0.05 mg total) onto the skin once a week. 4 patch 1  . levothyroxine (SYNTHROID, LEVOTHROID) 137 MCG tablet Take 137 mcg by mouth daily before breakfast.    . loratadine (CLARITIN) 10 MG tablet Take 10 mg by mouth as needed for allergies.    . meloxicam (MOBIC) 15 MG tablet Take 15 mg by mouth as needed for pain.    . metoprolol succinate (TOPROL XL) 25 MG 24 hr tablet Take 0.5 tablets (12.5 mg total) by mouth daily. 30 tablet 6  . Multiple Vitamins-Minerals (MULTIVITAL PO) Take by mouth.    . Naproxen Sodium (ALEVE PO) Take by mouth as needed.      . progesterone (PROMETRIUM) 200 MG capsule Take 1 capsule (200 mg total) by mouth daily. 90 capsule 0  . TRAMADOL HCL PO Take by mouth.    Marland Kitchen VITAMIN E PO Take by mouth.     No current facility-administered medications for this visit.          Past Medical History:  Diagnosis Date  . Basal cell carcinoma of right lower leg   . Fibrocystic disease of breast   . Hypothyroid   . IBS (irritable bowel syndrome)   . Mitral regurgitation   . MVP (mitral valve prolapse)    Murmur  . Pernicious anemia   . 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 APPENDECTOMY    . LAPAROSCOPIC CHOLECYSTECTOMY  03/2002  . TONSILLECTOMY AND ADENOIDECTOMY      Social History        Socioeconomic History  . Marital status: Married    Spouse name: Not on file  . Number of children: 2  . Years of education: Not on file  . Highest education level: Not on file  Social Needs  . Financial resource strain: Not on file  . Food insecurity - worry: Not on file  . Food insecurity -  inability: Not on file  . Transportation needs - medical: Not on file  . Transportation needs - non-medical: Not on file  Occupational History  . Not on file  Tobacco Use  . Smoking status: Never Smoker  . Smokeless tobacco: Never Used  Substance and Sexual Activity  . Alcohol use: No    Frequency: Never  . Drug use: No  . Sexual activity: Not on file  Other Topics Concern  . Not on file  Social History Narrative  . Not on file         Family History  Problem Relation Age of Onset  . Diabetes Father   . Heart attack Father   . Osteoporosis Mother   . Diabetes Brother   . Heart failure Brother   . Diabetes Paternal Grandmother     ROS: no fevers or chills, productive cough, hemoptysis, dysphasia, odynophagia, melena, hematochezia, dysuria, hematuria, rash, seizure activity, orthopnea, PND, pedal edema,  claudication. Remaining systems are negative.  Physical Exam: Well-developed well-nourished in no acute distress.  Skin is warm and dry.  HEENT is normal.  Neck is supple.  Chest is clear to auscultation with normal expansion.  Cardiovascular exam is regular rate and rhythm. 3/6 systolic murmur apex Abdominal exam nontender or distended. No masses palpated. Extremities show no edema. neuro grossly intact   A/P  1 mitral valve prolapse/mitral regurgitation-I have personally reviewed the patient's recent echocardiogram.  LV function is normal.  There is severe prolapse of posterior mitral valve leaflet with probable severe eccentric mitral regurgitation.  The left ventricle is not dilated (left ventricular end-systolic dimension of 29 mm).  Patient is not having symptoms.  I discussed options today including continued observation with follow-up echocardiograms and proceeding with mitral valve repair if patient develops symptoms, decreased LV function or left ventricular enlargement.  We also discussed the possibility of proceeding with transesophageal echocardiogram with consideration of early repair.  We have decided to proceed with transesophageal echocardiogram to further assess mitral valve and severity of mitral regurgitation.  We will then have further discussions concerning observation versus early repair.    2 PVCs/nonsustained ventricular tachycardia- I will add Toprol12.5 mg daily.  Follow and advance as needed.  3 palpitations-this is likely related to PVCs/nonsustained ventricular tachycardia based on Holter results.  Add Toprol as outlined.   Kirk Ruths, MD            For TEE; no changes Kirk Ruths

## 2017-12-09 NOTE — Interval H&P Note (Signed)
History and Physical Interval Note:  12/09/2017 9:26 AM  Tammy Montoya  has presented today for surgery, with the diagnosis of mitral valve disease  The various methods of treatment have been discussed with the patient and family. After consideration of risks, benefits and other options for treatment, the patient has consented to  Procedure(s): TRANSESOPHAGEAL ECHOCARDIOGRAM (TEE) (N/A) as a surgical intervention .  The patient's history has been reviewed, patient examined, no change in status, stable for surgery.  I have reviewed the patient's chart and labs.  Questions were answered to the patient's satisfaction.     Kirk Ruths

## 2017-12-09 NOTE — Discharge Instructions (Signed)
Mitral Valve Prolapse Mitral valve prolapse is a heart condition involving the mitral valve. This is the valve between the upper chamber (atrium) and the lower chamber (ventricle) on the left side of the heart. Normally, the mitral valve allows blood to flow from the atrium to the ventricle and then seals off the chambers from one another. If you have mitral valve prolapse, the valve does not work the way that it should. The flaps of the mitral valve do not form a tight seal between the atrium and the ventricle. When this happens, blood can flow the wrong way (mitral valve regurgitation). This causes symptoms of mitral valve prolapse. This condition can develop if:  The mitral valve flaps are larger and thicker than normal.  The valve opening stretches abnormally.  The valve flaps flop or bulge more than they should.  What are the causes? The cause of this condition is not known. However:  It is sometimes passed down (inherited) from a family member who also had the condition.  It can be a complication of other diseases.  What increases the risk? This condition is more like to develop in people with:  A family history of mitral valve prolapse.  Muscular dystrophy.  Graves disease.  Scoliosis.  A connective tissue disorder.  What are the signs or symptoms? Symptoms of this condition include:  A fast or irregular heartbeat (palpitations).  Fatigue.  Dizziness.  Shortness of breath.  Discomfort in the chest area.  In some cases, there are no symptoms for this condition. How is this diagnosed? This condition may be diagnosed based on:  Your symptoms and medical history.  A physical exam, which includes listening to your heart with a stethoscope.  Other tests to confirm the diagnosis. These may include imaging studies of your heart, such as: ? X-rays. These check for fluid in the lungs. ? Echocardiogram. This test uses sound waves to show the size of your heart and  how well it pumps. ? Doppler ultrasound. This test uses sound waves to take pictures of the blood flow through your valve. ? Electrocardiogram (ECG). This test records the electrical activity of your heart.  How is this treated? Treatment for this condition depends on how severe your symptoms are. If you need treatment, the goal is to relieve symptoms and prevent further problems, such as a type of heart infection called endocarditis. Treatment can include:  Medicines. You may need to take: ? Beta blockers. These help with chest discomfort and palpitations. ? Vasodilators. These improve forward blood flow through the valve, if it is leaking. ? Water pills (diuretics). These get rid of any extra fluid that is present.  Surgery to repair or replace the mitral valve.  If you do not have symptoms, you may not need treatment. Follow these instructions at home:  Take over-the-counter and prescription medicines only as told by your health care provider.  Get regular exercise. Ask your health care provider to recommend some activities that are safe for you to do.  Do not use any products that contain nicotine or tobacco, such as cigarettes and e-cigarettes. If you need help quitting, ask your health care provider.  Avoid being around secondhand smoke.  Brush and floss your teeth every day. See a dentist regularly. Having unhealthy teeth and gums may make endocarditis more likely.  Keep all follow-up visits as told by your health care provider. This is important. Contact a health care provider if:  You have any kind of abnormal heartbeat.  You are often very tired.  You have a cough that will not go away.  Your symptoms of mitral valve prolapse begin to get worse. Get help right away if:  You have chest pain or shortness of breath.  You start to have chills, body aches, and a fever. This information is not intended to replace advice given to you by your health care provider. Make  sure you discuss any questions you have with your health care provider. Document Released: 10/01/2000 Document Revised: 06/01/2016 Document Reviewed: 03/27/2016 Elsevier Interactive Patient Education  2018 [place].  Transesophageal Echocardiogram Transesophageal echocardiography (TEE) is a picture test of your heart using sound waves. The pictures taken can give very detailed pictures of your heart. This can help your doctor see if there are problems with your heart. TEE can check:  If your heart has blood clots in it.  How well your heart valves are working.  If you have an infection on the inside of your heart.  Some of the major arteries of your heart.  If your heart valve is working after a Office manager.  Your heart before a procedure that uses a shock to your heart to get the rhythm back to normal.  What happens before the procedure?  Do not eat or drink for 6 hours before the procedure or as told by your doctor.  Make plans to have someone drive you home after the procedure. Do not drive yourself home.  An IV tube will be put in your arm. What happens during the procedure?  You will be given a medicine to help you relax (sedative). It will be given through the IV tube.  A numbing medicine will be sprayed or gargled in the back of your throat to help numb it.  The tip of the probe is placed into the back of your mouth. You will be asked to swallow. This helps to pass the probe into your esophagus.  Once the tip of the probe is in the right place, your doctor can take pictures of your heart.  You may feel pressure at the back of your throat. What happens after the procedure?  You will be taken to a recovery area so the sedative can wear off.  Your throat may be sore and scratchy. This will go away slowly over time.  You will go home when you are fully awake and able to swallow liquids.  You should have someone stay with you for the next 24 hours.  Do not drive or  operate machinery for the next 24 hours. This information is not intended to replace advice given to you by your health care provider. Make sure you discuss any questions you have with your health care provider. Document Released: 08/01/2009 Document Revised: 03/11/2016 Document Reviewed: 04/05/2013 Elsevier Interactive Patient Education  [employer].

## 2017-12-09 NOTE — CV Procedure (Signed)
    Transesophageal Echocardiogram Note  Tammy Montoya [id number] [birth year]  Procedure: Transesophageal Echocardiogram Indications: MVP/MR  Procedure Details Consent: Obtained Time Out: Verified patient identification, verified procedure, site/side was marked, verified correct patient position, special equipment/implants available, Radiology Safety Procedures followed,  medications/allergies/relevent history reviewed, required imaging and test results available.  Performed  Medications:  During this procedure the patient is administered a total of Versed 5 mg and Fentanyl 50 mcg  to achieve and maintain moderate conscious sedation.  The patient's heart rate, blood pressure, and oxygen saturation are monitored continuously during the procedure. The period of conscious sedation is 30 minutes, of which I was present face-to-face 100% of this time.  Normal LV function; prolapse of posterior MV leaflet (P2); severe eccentric MR (3_D images obtained)   Complications: No apparent complications Patient did tolerate procedure well.  Kirk Ruths, MD

## 2017-12-10 ENCOUNTER — Encounter (HOSPITAL_COMMUNITY): Payer: Self-pay | Admitting: Cardiology

## 2017-12-13 ENCOUNTER — Encounter (HOSPITAL_COMMUNITY): Payer: Self-pay | Admitting: Cardiology

## 2018-01-04 NOTE — Progress Notes (Signed)
HPI: FU PVCsand MVP.Patient had a sleep study in June 2018.  She was noted to have PVCs.  Echocardiogram January 2019 showed normal LV systolic function, prolapse of posterior MV leaflet with moderate to severe mitral regurgitation and moderate to severe left atrial enlargement by report (I have reviewed personally and there is severe prolapse of post leaflet with probable severe MR).  Holter monitor January 2019 showed sinus rhythm with PACs, brief PAT, PVCs, rare couplet and nonsustained ventricular tachycardia. TEE 2/19 showed normal LV function, severe prolapse of P2 with severe MR, moderate to severe LAE. Since last seen patient has multiple complaints.  She has the sensation of dyspnea but also notes dyspnea with activities more in the past 1 month.  She feels occasional palpitations particularly with activities.  There is associated chest pain when she feels her palpitations.  She denies syncope.  She does have fatigue.  Current Outpatient Medications  Medication Sig Dispense Refill  . azelastine (ASTELIN) 0.1 % nasal spray Place 1 spray into both nostrils daily.    . cetirizine (ZYRTEC) 10 MG tablet Take 10 mg by mouth daily.    . Cholecalciferol (VITAMIN D) 2000 UNITS CAPS Take by mouth.    . Cyanocobalamin (VITAMIN B-12 IJ) Inject as directed once a week.    . esomeprazole (NEXIUM) 40 MG capsule Take 1 capsule by mouth daily.    Marland Kitchen estazolam (PROSOM) 2 MG tablet Take 1 mg by mouth at bedtime as needed (sleep).     Marland Kitchen estradiol (CLIMARA - DOSED IN MG/24 HR) 0.05 mg/24hr patch Place 1 patch (0.05 mg total) onto the skin once a week. 4 patch 1  . levothyroxine (SYNTHROID, LEVOTHROID) 88 MCG tablet 88 mcg daily before breakfast.     . liothyronine (CYTOMEL) 5 MCG tablet     . meloxicam (MOBIC) 15 MG tablet Take 15 mg by mouth as needed for pain.    . metoprolol succinate (TOPROL XL) 25 MG 24 hr tablet Take 0.5 tablets (12.5 mg total) by mouth daily. 30 tablet 6  . Multiple  Vitamins-Minerals (MULTIVITAL PO) Take by mouth.    . progesterone (PROMETRIUM) 200 MG capsule Take 1 capsule (200 mg total) by mouth daily. 90 capsule 0  . sodium chloride (OCEAN) 0.65 % SOLN nasal spray Place 1-2 sprays into both nostrils as needed for congestion.    . traMADol-acetaminophen (ULTRACET) 37.5-325 MG tablet Take 0.5-1 tablets by mouth every 6 (six) hours as needed for pain.     No current facility-administered medications for this visit.      Past Medical History:  Diagnosis Date  . Basal cell carcinoma of right lower leg   . Fibrocystic disease of breast   . Hypothyroid   . IBS (irritable bowel syndrome)   . Mitral regurgitation   . MVP (mitral valve prolapse)    Murmur  . Pernicious anemia   . Simple endometrial hyperplasia without atypia     Past Surgical History:  Procedure Laterality Date  . APPENDECTOMY    . LAPAROSCOPIC CHOLECYSTECTOMY  03/2002  . TEE WITHOUT CARDIOVERSION N/A 12/09/2017   Procedure: TRANSESOPHAGEAL ECHOCARDIOGRAM (TEE);  Surgeon: Lelon Perla, MD;  [facility] ENDOSCOPY;  Service: Cardiovascular;  Laterality: N/A;  . TONSILLECTOMY AND ADENOIDECTOMY      Social History   Socioeconomic History  . Marital status: Married    Spouse name: Not on file  . Number of children: 2  . Years of education: Not on file  .  Highest education level: Not on file  Occupational History  . Not on file  Social Needs  . Financial resource strain: Not on file  . Food insecurity:    Worry: Not on file    Inability: Not on file  . Transportation needs:    Medical: Not on file    Non-medical: Not on file  Tobacco Use  . Smoking status: Never Smoker  . Smokeless tobacco: Never Used  Substance and Sexual Activity  . Alcohol use: No    Frequency: Never  . Drug use: No  . Sexual activity: Not on file  Lifestyle  . Physical activity:    Days per week: Not on file    Minutes per session: Not on file  . Stress: Not on file  Relationships  .  Social connections:    Talks on phone: Not on file    Gets together: Not on file    Attends religious service: Not on file    Active member of club or organization: Not on file    Attends meetings of clubs or organizations: Not on file    Relationship status: Not on file  . Intimate partner violence:    Fear of current or ex partner: Not on file    Emotionally abused: Not on file    Physically abused: Not on file    Forced sexual activity: Not on file  Other Topics Concern  . Not on file  Social History Narrative  . Not on file    Family History  Problem Relation Age of Onset  . Diabetes Father   . Heart attack Father   . Osteoporosis Mother   . Diabetes Brother   . Heart failure Brother   . Diabetes Paternal Grandmother     ROS: Fatigue but  no fevers or chills, productive cough, hemoptysis, dysphasia, odynophagia, melena, hematochezia, dysuria, hematuria, rash, seizure activity, orthopnea, PND, pedal edema, claudication. Remaining systems are negative.  Physical Exam: Well-developed well-nourished in no acute distress.  Skin is warm and dry.  HEENT is normal.  Neck is supple.  Chest is clear to auscultation with normal expansion.  Cardiovascular exam is regular rate and rhythm. 3/6 systolic murmur apex Abdominal exam nontender or distended. No masses palpated. Extremities show no edema. neuro grossly intact  ECG-sinus bradycardia with occasional PVC.  No ST changes.  Personally reviewed  A/P  1 mitral valve prolapse/mitral regurgitation-I spent 45 minutes with patient and family in the office today.  We had long discussions concerning her mitral regurgitation. She has severe prolapse of posterior mitral valve leaflet (P2) with severe mitral regurgitation.  I explained that if we feel like her mild increased dyspnea on exertion and fatigue is secondary to her mitral valve prolapse/mitral regurgitation then that would be an indication to proceed with mitral valve repair.   We would also need to proceed if LV function deteriorates or there is evidence of left ventricular enlargement.  Alternatively we could proceed with early repair of mitral valve to avoid potential long-term complications including deterioration of LV function and atrial fibrillation.  Patient and husband are unsure of whether they want to proceed with surgery at this point.  They will discuss this further at home and I have asked them to contact me if they decide to proceed with surgery and we will arrange an evaluation with CVTS.  She would require cardiac catheterization prior to consideration of surgery.  2 PVCs/nonsustained ventricular tachycardia-patient continues to have occasional palpitations more prominent with  activities.  I will arrange an exercise treadmill to exclude exercise-induced arrhythmias.  I wonder if she may be having PVCs due to mitral valve prolapse placing tension on mitral valve chordae/papillary muscles.  She has not had syncope.  She states she feels fatigue and wonders whether Toprol may be contributing.  She did state her palpitations were worse off of this medication.  We have discontinued her Toprol today and I will try bystolic 2.5 mg daily to see if she tolerates this better.  They will also purchase Alivecor.   3 palpitations-this is felt secondary to PVCs and nonsustained ventricular tachycardia.  Continue beta-blocker with changes as outlined above.  Kirk Ruths, MD

## 2018-01-05 ENCOUNTER — Telehealth: Payer: Self-pay | Admitting: Cardiology

## 2018-01-05 NOTE — Telephone Encounter (Signed)
Follow up    As patient was getting ready to hang up she stated that the medication is giving her with SOB with exertion.

## 2018-01-05 NOTE — Telephone Encounter (Signed)
Spoke with patient and she has been taking Metoprolol Succ 25 mg 1/2 daily but did not take last night. She noticed she had more pounding in her heart this am. Her energy level no better today. Advised patient to continue her current medications and keep follow up next week as recommended, no appointments available prior to then.

## 2018-01-05 NOTE — Telephone Encounter (Signed)
New message    Pt c/o medication issue:  1. Name of Medication:  metoprolol succinate (TOPROL XL) 25 MG 24 hr tablet Take 0.5 tablets (12.5 mg total) by mouth daily.        2. How are you currently taking this medication (dosage and times per day)?  1/2 tab daily   3. Are you having a reaction (difficulty breathing--STAT)?  yes  4. What is your medication issue? When she started taking this medication she is having extreme fatigue, it is making her unable to get out of bed, unable to work

## 2018-01-13 ENCOUNTER — Encounter: Payer: Self-pay | Admitting: Cardiology

## 2018-01-13 ENCOUNTER — Ambulatory Visit: Payer: Medicare Other | Admitting: Cardiology

## 2018-01-13 VITALS — BP 110/64 | HR 59 | Ht 69.0 in | Wt 160.8 lb

## 2018-01-13 DIAGNOSIS — R002 Palpitations: Secondary | ICD-10-CM | POA: Diagnosis not present

## 2018-01-13 DIAGNOSIS — I341 Nonrheumatic mitral (valve) prolapse: Secondary | ICD-10-CM | POA: Diagnosis not present

## 2018-01-13 DIAGNOSIS — I340 Nonrheumatic mitral (valve) insufficiency: Secondary | ICD-10-CM

## 2018-01-13 DIAGNOSIS — I493 Ventricular premature depolarization: Secondary | ICD-10-CM

## 2018-01-13 MED ORDER — NEBIVOLOL HCL 2.5 MG PO TABS: 2.5000 mg | ORAL_TABLET | Freq: Every day | ORAL | 3 refills | Status: DC

## 2018-01-13 NOTE — Patient Instructions (Signed)
Medication Instructions:   STOP METOPROLOL  START BYSTOLIC 2.5 MG ONCE DAILY  Testing/Procedures:  Your physician has requested that you have an exercise tolerance test. For further information please visit [email or web address]. Please also follow instruction sheet, as given.ON BETA BLOCKER PER MD    Follow-Up:  Your physician recommends that you schedule a follow-up appointment in: [place]   If you need a refill on your cardiac medications before your next appointment, please call your pharmacy.

## 2018-01-16 ENCOUNTER — Encounter: Payer: Self-pay | Admitting: Cardiology

## 2018-01-16 ENCOUNTER — Telehealth: Payer: Self-pay | Admitting: Cardiology

## 2018-01-16 DIAGNOSIS — I341 Nonrheumatic mitral (valve) prolapse: Secondary | ICD-10-CM

## 2018-01-16 DIAGNOSIS — I340 Nonrheumatic mitral (valve) insufficiency: Secondary | ICD-10-CM

## 2018-01-16 NOTE — Addendum Note (Signed)
Addended by: Patria Mane A on: 01/16/2018 12:18 PM   Modules accepted: Orders

## 2018-01-16 NOTE — Telephone Encounter (Signed)
Please schedule ov with Dr Roxy Manns for further evaluation of MV repair. Kirk Ruths

## 2018-01-16 NOTE — Progress Notes (Addendum)
Patient contacted the office and would like to proceed with mitral valve repair.  I will arrange office visit with Dr. Roxy Manns.  If he agrees we should proceed with mitral valve repair at this point we will then arrange right and left heart catheterization prior to surgery.  Note patient has a dye allergy and would need to be premedicated prior to catheterization. Kirk Ruths, MD

## 2018-01-16 NOTE — Telephone Encounter (Signed)
New Message   Patient is calling because she was advised to notify Dr. Stanford Breed when she was ready to have surgery. Please call to discuss.

## 2018-01-16 NOTE — Telephone Encounter (Signed)
Returned call to patient, patient states she has decided she would like to proceed with valve surgery.     OV 3/29 with Dr. Stanford Breed.   Advised I would route to MD to make aware and will return call to discuss next steps.

## 2018-01-16 NOTE — Telephone Encounter (Signed)
Returned call to patient to make aware referral has been placed to Dr. Roxy Manns for MV repair.   Patient aware office will call to schedule.    Patient states she is having an episode of SOB (has been occurring on and off, noted in Dr. Stanford Breed note 3/29).   Does not correlate this with exertion.  This episode occurred at rest.   Patient took BP and HR while on phone BP 159/88 HR 63.   After speaking with patient for a few minutes, SOB resolved.   Patient is very anxious about upcoming possible surgery.  Patient is aware that everything will be discussed in great detail with Dr. Roxy Manns.   Patient aware and verbalized understanding.

## 2018-01-18 ENCOUNTER — Other Ambulatory Visit: Payer: Self-pay

## 2018-01-18 ENCOUNTER — Other Ambulatory Visit: Payer: Self-pay | Admitting: *Deleted

## 2018-01-18 ENCOUNTER — Institutional Professional Consult (permissible substitution): Payer: Medicare Other | Admitting: Thoracic Surgery (Cardiothoracic Vascular Surgery)

## 2018-01-18 ENCOUNTER — Encounter: Payer: Self-pay | Admitting: Thoracic Surgery (Cardiothoracic Vascular Surgery)

## 2018-01-18 VITALS — BP 105/62 | HR 52 | Resp 16 | Ht 69.0 in | Wt 160.0 lb

## 2018-01-18 DIAGNOSIS — I712 Thoracic aortic aneurysm, without rupture, unspecified: Secondary | ICD-10-CM

## 2018-01-18 DIAGNOSIS — I341 Nonrheumatic mitral (valve) prolapse: Secondary | ICD-10-CM | POA: Diagnosis not present

## 2018-01-18 DIAGNOSIS — Z01818 Encounter for other preprocedural examination: Secondary | ICD-10-CM

## 2018-01-18 DIAGNOSIS — I340 Nonrheumatic mitral (valve) insufficiency: Secondary | ICD-10-CM | POA: Diagnosis not present

## 2018-01-18 DIAGNOSIS — I7409 Other arterial embolism and thrombosis of abdominal aorta: Secondary | ICD-10-CM

## 2018-01-18 NOTE — Patient Instructions (Signed)
Continue all previous medications without any changes at this time  

## 2018-01-18 NOTE — Progress Notes (Signed)
[address]       [place] [address]             [phone]     CARDIOTHORACIC SURGERY CONSULTATION REPORT  Referring Provider is Stanford Breed, Denice Bors, MD PCP is Belva Bertin, Bennetta Laos, [place]  Chief Complaint  Patient presents with  . Mitral Regurgitation    TEE 12/09/17    HPI:  Patient is a 67 year old female with history of mitral valve prolapse, rheumatic fever during childhood, frequent PVCs and palpitations, obstructive sleep apnea, and hypothyroidism who has been referred for surgical consultation to discuss treatment options for management of mitral valve prolapse with severe primary mitral regurgitation.  The patient states that she had rheumatic fever during childhood.  She was diagnosed with mitral valve prolapse more than 30 years ago.  She has otherwise remained healthy and fairly active physically until several months ago when she began to experience palpitations and fatigue.  She underwent a sleep study and was diagnosed with obstructive sleep apnea.  She was referred for cardiology consultation and evaluated by Dr. Stanford Breed in early December.  Transthoracic echocardiogram performed October 21, 2017 revealed normal left ventricular size and systolic function with mitral valve prolapse and moderate to severe mitral regurgitation.  There was moderate to severe left atrial enlargement and findings consistent with mild pulmonary hypertension.  The patient subsequently underwent transesophageal echocardiogram on December 09, 2017.  This confirmed the presence of mitral valve prolapse with severe prolapse involving the middle scallop of the posterior leaflet.  There was severe mitral regurgitation.  There was normal left ventricular systolic function.  There was moderate to severe left atrial enlargement.  No other significant abnormalities were noted.  Patient was referred for surgical consultation.  The patient is married and lives locally in [place] with  her husband.  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  Over the last several months the patient has developed progressive exertional shortness of breath and fatigue.  She states that she now gets intermittently short of breath with low-level activity and occasionally at rest.  She has had 2 or 3 episodes where she woke up in the middle night short of breath.  Shortness of breath is relieved by sitting upright.  She has had some intermittent tightness across her chest that can occur both with activity and at rest.  She has frequent palpitations, although this has improved since she has been treated with a beta-blocker.  She has decreased energy.  She has gained 15 pounds of weight and noticed some mild swelling around her left ankle.  She has intermittently complained of a persistent dry hacking cough.  She has had occasional dizzy spells without syncope.  Past Medical History:  Diagnosis Date  . Basal cell carcinoma of right lower leg   . Fibrocystic disease of breast   . Hypothyroid   . IBS (irritable bowel syndrome)   . Mitral regurgitation   . MVP (mitral valve prolapse)    Murmur  . Pernicious anemia   . Simple endometrial hyperplasia without atypia     Past Surgical History:  Procedure Laterality Date  . APPENDECTOMY    . LAPAROSCOPIC CHOLECYSTECTOMY  03/2002  . TEE WITHOUT CARDIOVERSION N/A 12/09/2017   Procedure: TRANSESOPHAGEAL ECHOCARDIOGRAM (TEE);  Surgeon: Lelon Perla, MD;  Location: [facility] ENDOSCOPY;  Service: Cardiovascular;  Laterality: N/A;  . TONSILLECTOMY AND ADENOIDECTOMY      Family History  Problem  Relation Age of Onset  . Diabetes Father   . Heart attack Father   . Osteoporosis Mother   . Diabetes Brother   . Heart failure Brother   . Diabetes Paternal Grandmother     Social History   Socioeconomic History  . Marital status: Married    Spouse name: Not on file  . Number of children: 2  . Years of education: Not  on file  . Highest education level: Not on file  Occupational History  . Not on file  Social Needs  . Financial resource strain: Not on file  . Food insecurity:    Worry: Not on file    Inability: Not on file  . Transportation needs:    Medical: Not on file    Non-medical: Not on file  Tobacco Use  . Smoking status: Never Smoker  . Smokeless tobacco: Never Used  Substance and Sexual Activity  . Alcohol use: No    Frequency: Never  . Drug use: No  . Sexual activity: Not on file  Lifestyle  . Physical activity:    Days per week: Not on file    Minutes per session: Not on file  . Stress: Not on file  Relationships  . Social connections:    Talks on phone: Not on file    Gets together: Not on file    Attends religious service: Not on file    Active member of club or organization: Not on file    Attends meetings of clubs or organizations: Not on file    Relationship status: Not on file  . Intimate partner violence:    Fear of current or ex partner: Not on file    Emotionally abused: Not on file    Physically abused: Not on file    Forced sexual activity: Not on file  Other Topics Concern  . Not on file  Social History Narrative  . Not on file    Current Outpatient Medications  Medication Sig Dispense Refill  . azelastine (ASTELIN) 0.1 % nasal spray Place 1 spray into both nostrils daily.    . cetirizine (ZYRTEC) 10 MG tablet Take 10 mg by mouth daily.    . Cholecalciferol (VITAMIN D) 2000 UNITS CAPS Take by mouth.    . Cyanocobalamin (VITAMIN B-12 IJ) Inject as directed once a week.    . esomeprazole (NEXIUM) 40 MG capsule Take 1 capsule by mouth daily.    Marland Kitchen estazolam (PROSOM) 2 MG tablet Take 1 mg by mouth at bedtime as needed (sleep).     Marland Kitchen estradiol (CLIMARA - DOSED IN MG/24 HR) 0.05 mg/24hr patch Place 1 patch (0.05 mg total) onto the skin once a week. 4 patch 1  . levothyroxine (SYNTHROID, LEVOTHROID) 88 MCG tablet 88 mcg daily before breakfast.     .  liothyronine (CYTOMEL) 5 MCG tablet     . meloxicam (MOBIC) 15 MG tablet Take 15 mg by mouth as needed for pain.    . Multiple Vitamins-Minerals (MULTIVITAL PO) Take by mouth.    . nebivolol (BYSTOLIC) 2.5 MG tablet Take 1 tablet (2.5 mg total) by mouth daily. 90 tablet 3  . progesterone (PROMETRIUM) 200 MG capsule Take 1 capsule (200 mg total) by mouth daily. 90 capsule 0  . sodium chloride (OCEAN) 0.65 % SOLN nasal spray Place 1-2 sprays into both nostrils as needed for congestion.    . traMADol-acetaminophen (ULTRACET) 37.5-325 MG tablet Take 0.5-1 tablets by mouth every 6 (six) hours as needed for  pain.     No current facility-administered medications for this visit.     Allergies  Allergen Reactions  . Contrast Media [Iodinated Diagnostic Agents]     Itching  . Protonix [Pantoprazole Sodium]     Itching  . Protonix [Pantoprazole] Itching      Review of Systems:   General:  normal appetite, decreased energy, + weight gain, no weight loss, no fever  Cardiac:  + chest pain with exertion, + chest pain at rest, + SOB with exertion, occasional resting SOB, + PND, + orthopnea, + palpitations, no arrhythmia, no atrial fibrillation, + mild LE edema, + dizzy spells, no syncope  Respiratory:  + shortness of breath, no home oxygen, no productive cough, + dry cough, + bronchitis, no wheezing, no hemoptysis, no asthma, no pain with inspiration or cough, + sleep apnea, + CPAP at night  GI:   no difficulty swallowing, no reflux, + frequent heartburn, no hiatal hernia, no abdominal pain, no constipation, no diarrhea, no hematochezia, no hematemesis, no melena  GU:   no dysuria,  no frequency, no urinary tract infection, no hematuria, no kidney stones, no kidney disease  Vascular:  no pain suggestive of claudication, no pain in feet, no leg cramps, + varicose veins, no DVT, no non-healing foot ulcer  Neuro:   no stroke, no TIA's, no seizures, no headaches, no temporary blindness one eye,  no slurred  speech, no peripheral neuropathy, no chronic pain, no instability of gait, no memory/cognitive dysfunction  Musculoskeletal: no arthritis except mild lower back, no joint swelling, no myalgias, no difficulty walking, normal mobility   Skin:   no rash, no itching, no skin infections, no pressure sores or ulcerations  Psych:   no anxiety, no depression, no nervousness, no unusual recent stress  Eyes:   no blurry vision, no floaters, no recent vision changes, + wears glasses or contacts  ENT:   no hearing loss, no loose or painful teeth, no dentures, last saw dentist within the past year  Hematologic:  no easy bruising, no abnormal bleeding, no clotting disorder, no frequent epistaxis  Endocrine:  no diabetes, does not check CBG's at home     Physical Exam:   BP 105/62 (BP Location: Right Arm, Patient Position: Sitting, Cuff Size: Large)   Pulse (!) 52   Resp 16   Ht 5\' 9"  (1.753 m)   Wt 160 lb (72.6 kg)   LMP 09/17/2012   SpO2 98% Comment: ON RA  BMI 23.63 kg/m   General:    well-appearing  HEENT:  Unremarkable   Neck:   no JVD, no bruits, no adenopathy   Chest:   clear to auscultation, symmetrical breath sounds, no wheezes, no rhonchi   CV:   RRR, grade III/VI holosystolic murmur   Abdomen:  soft, non-tender, no masses   Extremities:  warm, well-perfused, pulses diminished, trace LE edema  Rectal/GU  Deferred  Neuro:   Grossly non-focal and symmetrical throughout  Skin:   Clean and dry, no rashes, no breakdown   Diagnostic Tests:  Transthoracic Echocardiography  Patient:    Tammy Montoya, Tammy Montoya MR #:       [id number] Study Date: 10/21/2017 Gender:     F Age:        76 Height:     175.3 cm Weight:     70 kg BSA:        1.85 m^2 Pt. Status: Room:   [place]  SONOGRAPHER  Jimmy Reel,  RDCS  cc:  ------------------------------------------------------------------- LV EF: 55% -   60%  ------------------------------------------------------------------- Study Conclusions  - Left ventricle: The cavity size was normal. Wall thickness was   normal. Systolic function was normal. The estimated ejection   fraction was in the range of 55% to 60%. Wall motion was normal;   there were no regional wall motion abnormalities. - Aortic valve: Valve area (VTI): 1.89 cm^2. Valve area (Vmax):   1.83 cm^2. Valve area (Vmean): 2.03 cm^2. - Mitral valve: Moderate, holosystolicprolapse. There was moderate   to severe regurgitation. - Left atrium: The atrium was moderately to severely dilated. - Pulmonary arteries: PA peak pressure: 36 mm Hg (S).  Impressions:  - Moderate thinkening of the mitral valve. Prolapse of the   posterior leaflet causing moderate to severe eccentric mitral   regurgitation. If clinically indicated, suggest TEE evaluation   for intervention.   LA is moderate to severely enlarged.   LVEF is visually estimated to be 55-60%.  ------------------------------------------------------------------- Study data:  No prior study was available for comparison.  Study status:  Routine.  Procedure:  The patient reported no pain pre or post test. Transthoracic echocardiography. Image quality was adequate.  Study completion:  There were no complications. Transthoracic echocardiography.  M-mode, complete 2D, spectral Doppler, and color Doppler.  Birthdate:  Patient birthdate: Nov 23, 1950.  Age:  Patient is 67 yr old.  Sex:  Gender: female. BMI: 22.8 kg/m^2.  Blood pressure:     118/78  Patient status: Outpatient.  Study date:  Study date: 10/21/2017. Study time: 03:23 PM.  -------------------------------------------------------------------  ------------------------------------------------------------------- Left ventricle:  The cavity size was normal. Wall  thickness was normal. Systolic function was normal. The estimated ejection fraction was in the range of 55% to 60%. Wall motion was normal; there were no regional wall motion abnormalities.  ------------------------------------------------------------------- Aortic valve:   Trileaflet; normal thickness leaflets. Mobility was not restricted.  Doppler:  Transvalvular velocity was within the normal range. There was no stenosis. There was no regurgitation. VTI ratio of LVOT to aortic valve: 0.6. Valve area (VTI): 1.89 cm^2. Indexed valve area (VTI): 1.02 cm^2/m^2. Peak velocity ratio of LVOT to aortic valve: 0.58. Valve area (Vmax): 1.83 cm^2. Indexed valve area (Vmax): 0.99 cm^2/m^2. Mean velocity ratio of LVOT to aortic valve: 0.65. Valve area (Vmean): 2.03 cm^2. Indexed valve area (Vmean): 1.1 cm^2/m^2.    Mean gradient (S): 4 mm Hg. Peak gradient (S): 9 mm Hg.  ------------------------------------------------------------------- Aorta:  Aortic root: The aortic root was normal in size.  ------------------------------------------------------------------- Mitral valve:   Mildly thickened leaflets . Mobility was not restricted.  Moderate, holosystolicprolapse.  Doppler: Transvalvular velocity was within the normal range. There was no evidence for stenosis. There was moderate to severe regurgitation.   Peak gradient (D): 6 mm Hg.  ------------------------------------------------------------------- Left atrium:  The atrium was moderately to severely dilated.   ------------------------------------------------------------------- Right ventricle:  The cavity size was normal. Wall thickness was normal. Systolic function was normal.  ------------------------------------------------------------------- Pulmonic valve:   Not well visualized.  The valve appears to be grossly normal.    Doppler:  Transvalvular velocity was within the normal range. There was no evidence for  stenosis.  ------------------------------------------------------------------- Tricuspid valve:   Structurally normal valve.    Doppler: Transvalvular velocity was within the normal range. There was no regurgitation.  ------------------------------------------------------------------- Pulmonary artery:   The main pulmonary artery  was normal-sized. Systolic pressure was within the normal range.  ------------------------------------------------------------------- Right atrium:  The atrium was normal in size.  ------------------------------------------------------------------- Pericardium:  There was no pericardial effusion.  ------------------------------------------------------------------- Systemic veins: Inferior vena cava: The vessel was normal in size.  ------------------------------------------------------------------- Measurements   Left ventricle                           Value          Reference  LV ID, ED, PLAX chordal                  49.5  mm       43 - 52  LV ID, ES, PLAX chordal                  29    mm       23 - 38  LV fx shortening, PLAX chordal           41    %        >=29  LV PW thickness, ED                      9.9   mm       ----------  IVS/LV PW ratio, ED                      0.78           <=1.3  Stroke volume, 2D                        57    ml       ----------  Stroke volume/bsa, 2D                    31    ml/m^2   ----------  LV e&', lateral                           7.72  cm/s     ----------  LV E/e&', lateral                         15.67          ----------  LV e&', medial                            8.59  cm/s     ----------  LV E/e&', medial                          14.09          ----------  LV e&', average                           8.16  cm/s     ----------  LV E/e&', average                         14.84          ----------  Longitudinal strain, TDI                 19    %        ----------  Ventricular septum                        Value          Reference  IVS thickness, ED                        7.69  mm       ----------    LVOT                                     Value          Reference  LVOT ID, S                               20    mm       ----------  LVOT area                                3.14  cm^2     ----------  LVOT peak velocity, S                    85.5  cm/s     ----------  LVOT mean velocity, S                    63.3  cm/s     ----------  LVOT VTI, S                              18.2  cm       ----------    Aortic valve                             Value          Reference  Aortic valve peak velocity, S            147   cm/s     ----------  Aortic valve mean velocity, S            98    cm/s     ----------  Aortic valve VTI, S                      30.2  cm       ----------  Aortic mean gradient, S                  4     mm Hg    ----------  Aortic peak gradient, S                  9     mm Hg    ----------  VTI ratio, LVOT/AV                       0.6            ----------  Aortic valve area, VTI                   1.89  cm^2     ----------  Aortic valve area/bsa, VTI  1.02  cm^2/m^2 ----------  Velocity ratio, peak, LVOT/AV            0.58           ----------  Aortic valve area, peak velocity         1.83  cm^2     ----------  Aortic valve area/bsa, peak              0.99  cm^2/m^2 ----------  velocity  Velocity ratio, mean, LVOT/AV            0.65           ----------  Aortic valve area, mean velocity         2.03  cm^2     ----------  Aortic valve area/bsa, mean              1.1   cm^2/m^2 ----------  velocity    Aorta                                    Value          Reference  Aortic root ID, ED                       35    mm       ----------  Ascending aorta ID, A-P, S               30    mm       ----------    Left atrium                              Value          Reference  LA ID, A-P, ES                           37    mm       ----------  LA ID/bsa, A-P                            2     cm/m^2   <=2.2  LA volume, S                             80.4  ml       ----------  LA volume/bsa, S                         43.5  ml/m^2   ----------  LA volume, ES, 1-p A4C                   60    ml       ----------  LA volume/bsa, ES, 1-p A4C               32.5  ml/m^2   ----------  LA volume, ES, 1-p A2C                   103   ml       ----------  LA volume/bsa, ES, 1-p A2C               55.7  ml/m^2   ----------  Mitral valve                             Value          Reference  Mitral E-wave peak velocity              121   cm/s     ----------  Mitral A-wave peak velocity              61.6  cm/s     ----------  Mitral deceleration time          (H)    290   ms       150 - 230  Mitral peak gradient, D                  6     mm Hg    ----------  Mitral E/A ratio, peak                   2              ----------  Mitral regurg VTI, PISA                  203   cm       ----------  Mitral ERO, PISA                         0.4   cm^2     ----------  Mitral regurg volume, PISA               81    ml       ----------    Pulmonary arteries                       Value          Reference  PA pressure, S, DP                (H)    36    mm Hg    <=30    Tricuspid valve                          Value          Reference  Tricuspid regurg peak velocity           263   cm/s     ----------  Tricuspid peak RV-RA gradient            28    mm Hg    ----------    Right atrium                             Value          Reference  RA ID, S-I, ES, A4C               (H)    54.8  mm       34 - 49  RA area, ES, A4C                         17.1  cm^2     8.3 - 19.5  RA volume, ES, A/L  43.4  ml       ----------  RA volume/bsa, ES, A/L                   23.5  ml/m^2   ----------    Systemic veins                           Value          Reference  Estimated CVP                            8     mm Hg    ----------    Right ventricle                          Value           Reference  RV pressure, S, DP                (H)    36    mm Hg    <=30  Legend: (L)  and  (H)  mark values outside specified reference range.  ------------------------------------------------------------------- Prepared and Electronically Authenticated by  Jyl Heinz, MD 2019-01-04T17:22:30    Transesophageal Echocardiography  Patient:    Andersen, Tammy Montoya MR #:       [id number] Study Date: 12/09/2017 Gender:     F Age:        65 Height:     175.3 cm Weight:     70.9 kg BSA:        1.86 m^2 Pt. Status: Room:   [place]  ORDERING     Kirk Ruths  PERFORMING   Kirk Ruths  REFERRING    Kirk Ruths  SONOGRAPHER  Dance, Tiffany  cc:  ------------------------------------------------------------------- LV EF: 55% -   60%  ------------------------------------------------------------------- Indications:      Mitral valve prolapse 424.0.  Mitral regurgitation 424.0.  ------------------------------------------------------------------- History:   PMH:  Hypothyroidism.  ------------------------------------------------------------------- Study Conclusions  - Left ventricle: Systolic function was normal. The estimated   ejection fraction was in the range of 55% to 60%. Wall motion was   normal; there were no regional wall motion abnormalities. - Aortic valve: No evidence of vegetation. - Mitral valve: Thickening. Severe prolapse, involving the middle   scallop of the posterior leaflet. There was severe regurgitation   directed eccentrically. - Left atrium: The atrium was moderately to severely dilated. No   evidence of thrombus in the atrial cavity or appendage. - Right atrium: No evidence of thrombus in the atrial cavity or   appendage. - Atrial septum: No defect or patent foramen ovale was identified. - Tricuspid valve: No evidence of vegetation. - Pulmonic valve: No evidence of  vegetation.  Impressions:  - Normal LV function; severe prolapse of posterior MV leaflet (P2);   severe eccentric MR; moderate to severe LAE; mild TR.  ------------------------------------------------------------------- Study data:   Study status:  Routine.  Consent:  The risks, benefits, and alternatives to the procedure were explained to the patient and informed consent was obtained.  Procedure:  The patient reported no pain pre or post test. Initial setup. The patient was brought to the laboratory. Surface ECG leads were monitored. Sedation. Moderate sedation with intermittent deep sedation was administered by cardiology staff. Transesophageal echocardiography. An adult multiplane transesophageal  probe was inserted by the attending cardiologistwithout difficulty. Image quality was adequate.  Study completion:  The patient tolerated the procedure well. There were no complications.  Administered medications: Fentanyl, 85mcg, IV.  Midazolam, 5mg , IV.          Diagnostic transesophageal echocardiography.  2D and color Doppler. Birthdate:  Patient birthdate: [birth year].  Age:  Patient is 67 yr old.  Sex:  Gender: female.    BMI: 23.1 kg/m^2.  Blood pressure:   98/56  Patient status:  Outpatient.  Study date:  Study date: 12/09/2017. Study time: 10:04 AM.  Location:  Endoscopy.  -------------------------------------------------------------------  ------------------------------------------------------------------- Left ventricle:  Systolic function was normal. The estimated ejection fraction was in the range of 55% to 60%. Wall motion was normal; there were no regional wall motion abnormalities.  ------------------------------------------------------------------- Aortic valve:   Structurally normal valve.   Cusp separation was normal.  No evidence of vegetation.  Doppler:  There was no regurgitation.  ------------------------------------------------------------------- Aorta:   Descending aorta: The descending aorta had mild diffuse disease.  ------------------------------------------------------------------- Mitral valve:   Thickening.  Severe prolapse, involving the middle scallop of the posterior leaflet.  Doppler:  There was severe regurgitation directed eccentrically.  ------------------------------------------------------------------- Left atrium:  The atrium was moderately to severely dilated.  No evidence of thrombus in the atrial cavity or appendage.  ------------------------------------------------------------------- Atrial septum:  No defect or patent foramen ovale was identified.   ------------------------------------------------------------------- Right ventricle:  The cavity size was normal. Systolic function was normal.  ------------------------------------------------------------------- Pulmonic valve:    Structurally normal valve.   Cusp separation was normal.  No evidence of vegetation.  Doppler:  There was mild regurgitation.  ------------------------------------------------------------------- Tricuspid valve:   Structurally normal valve.   Leaflet separation was normal.  No evidence of vegetation.  Doppler:  There was mild regurgitation.  ------------------------------------------------------------------- Right atrium:  The atrium was normal in size.  No evidence of thrombus in the atrial cavity or appendage.  ------------------------------------------------------------------- Pericardium:  There was no pericardial effusion.   ------------------------------------------------------------------- Prepared and Electronically Authenticated by  Kirk Ruths 2019-02-22T11:29:23    Impression:  Patient has mitral valve prolapse with stage D severe symptomatic primary mitral regurgitation.  She presents with recent progression of symptoms of exertional shortness of breath, atypical chest pain, frequent palpitations, and  occasional dizzy spells.  I have personally reviewed the patient's recent transthoracic and transesophageal echocardiograms.  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  Left ventricular systolic function remains normal.  I agree the patient needs elective mitral valve repair.   Plan:  The patient and her husband were counseled at length regarding the indications, risks and potential benefits of mitral valve repair.  The rationale for elective surgery has been explained, including a comparison between surgery and continued medical therapy with close follow-up.  The likelihood of successful and durable valve repair has been discussed with particular reference to the findings of their recent echocardiogram.  Based upon these findings and previous experience, I have quoted them a greater than 95 percent likelihood of successful valve repair.  Alternative surgical approaches have been discussed including a comparison between conventional sternotomy and minimally-invasive techniques.  The relative risks and benefits of each have been reviewed as they pertain to the patient's specific circumstances, and all of their questions have been addressed.  Expectations for the patient's postoperative convalescence have been discussed.  The patient understands  and accepts all potential risks of surgery including but not limited to risk of death, stroke or other neurologic complication, myocardial infarction, congestive heart failure, respiratory failure, renal failure, bleeding requiring transfusion and/or reexploration, arrhythmia, infection or other wound complications, pneumonia, pleural and/or pericardial effusion, pulmonary embolus, aortic dissection or other major vascular complication, or delayed complications related to valve repair or replacement  including but not limited to structural valve deterioration and failure, thrombosis, embolization, endocarditis, or paravalvular leak.  All of their questions have been answered.  The patient is interested in proceeding with surgery in the near future.  As a next step she will need to undergo left and right heart catheterization.  In the absence of significant coronary artery disease she will likely be a good candidate for minimally invasive approach for surgery.  She will undergo CT angiography to evaluate the feasibility of peripheral arterial cannulation for surgery.  We tentatively plan to proceed with surgery on Mar 01, 2018.  The patient will return to our office for follow-up prior to surgery on Feb 27, 2018.   I spent in excess of 90 minutes during the conduct of this office consultation and >50% of this time involved direct face-to-face encounter with the patient for counseling and/or coordination of their care.   [name], MD 01/18/2018 3:37 PM

## 2018-01-19 ENCOUNTER — Other Ambulatory Visit: Payer: Self-pay | Admitting: *Deleted

## 2018-01-19 ENCOUNTER — Telehealth (HOSPITAL_COMMUNITY): Payer: Self-pay

## 2018-01-19 ENCOUNTER — Telehealth: Payer: Self-pay

## 2018-01-19 DIAGNOSIS — I341 Nonrheumatic mitral (valve) prolapse: Secondary | ICD-10-CM

## 2018-01-19 DIAGNOSIS — I340 Nonrheumatic mitral (valve) insufficiency: Secondary | ICD-10-CM

## 2018-01-19 NOTE — Telephone Encounter (Signed)
Patient would like to have cath done 4/19. She understands she will be called tomorrow to confirm date, set up lab appointment and review instructions.

## 2018-01-19 NOTE — Telephone Encounter (Signed)
-----   Message from Barkley Boards, RN sent at 01/18/2018  4:07 PM EDT ----- Alycia Rossetti,  This is an open surgery that Roxy Manns will be performing.  Can you arrange the cath for this patient?  Thanks, Lauren  ----- Message ----- From: [name], MD Sent: 01/18/2018   3:56 PM To: Barkley Boards, RN, Sherren Mocha, MD  Here is another patient with severe MR who needs L+R heart cath prior to surgery.  Can you get her on the cath schedule at your convenience.  Surgery isn't until mid May.  Thx  Cub

## 2018-01-19 NOTE — Telephone Encounter (Signed)
Encounter complete. 

## 2018-01-20 ENCOUNTER — Telehealth (HOSPITAL_COMMUNITY): Payer: Self-pay

## 2018-01-20 NOTE — Telephone Encounter (Signed)
Encounter complete. 

## 2018-01-23 NOTE — Telephone Encounter (Signed)
Scheduled patient 4/19 for R/L heart cath with Dr. Burt Knack. Dx: MR Arrival time: 1000 for 1200 case  Will call patient to review instructions and schedule lab work.

## 2018-01-23 NOTE — Telephone Encounter (Signed)
Ms. Tammy Montoya reports she will now be out of town 4/19 and cath will need to be rescheduled.  Patient understands she will be called tomorrow to make new plan. She understands CT scans may be moved to accommodate cath schedule.

## 2018-01-23 NOTE — Telephone Encounter (Signed)
Follow up     Patient calling to reschedule cath

## 2018-01-24 ENCOUNTER — Ambulatory Visit (HOSPITAL_COMMUNITY)
Admission: RE | Admit: 2018-01-24 | Discharge: 2018-01-24 | Disposition: A | Discharge status: Home / Self Care | Payer: Medicare Other | Source: Ambulatory Visit | Attending: Cardiology | Admitting: Cardiology

## 2018-01-24 ENCOUNTER — Encounter (HOSPITAL_COMMUNITY): Payer: Self-pay

## 2018-01-24 DIAGNOSIS — R002 Palpitations: Secondary | ICD-10-CM | POA: Diagnosis not present

## 2018-01-24 LAB — EXERCISE TOLERANCE TEST
CHL CUP MPHR: 154 {beats}/min
CHL CUP RESTING HR STRESS: 67 {beats}/min
CHL RATE OF PERCEIVED EXERTION: 18
CSEPEDS: 30 s
CSEPEW: 9.3 METS
Exercise duration (min): 7 min
Peak HR: 131 {beats}/min
Percent HR: 85 %

## 2018-01-24 NOTE — Telephone Encounter (Signed)
CT has been rescheduled to 4/29. Lab appointment scheduled same day. Letter placed at front desk for patient pick-up when she gets labs drawn.  Confirmed with patient she understands/agrees with plan. She was grateful for assistance.

## 2018-01-24 NOTE — Telephone Encounter (Signed)
Spoke with [facility].  Patient will have R/L heart cath 5/6. T[facility] will call and reschedule CT scans to be ~1 week prior.  Patient understands she will have labs at our office (address provided) on the same day. She also understands she will pick up instruction letter at that time. She was grateful for call and agrees with treatment plan.

## 2018-02-07 ENCOUNTER — Ambulatory Visit: Payer: Medicare Other | Admitting: Cardiology

## 2018-02-13 ENCOUNTER — Other Ambulatory Visit: Payer: Medicare Other | Admitting: *Deleted

## 2018-02-13 ENCOUNTER — Ambulatory Visit
Admission: RE | Admit: 2018-02-13 | Discharge: 2018-02-13 | Disposition: A | Discharge status: Home / Self Care | Payer: Medicare Other | Source: Ambulatory Visit | Attending: Thoracic Surgery (Cardiothoracic Vascular Surgery) | Admitting: Thoracic Surgery (Cardiothoracic Vascular Surgery)

## 2018-02-13 DIAGNOSIS — I340 Nonrheumatic mitral (valve) insufficiency: Secondary | ICD-10-CM

## 2018-02-13 DIAGNOSIS — I7409 Other arterial embolism and thrombosis of abdominal aorta: Secondary | ICD-10-CM

## 2018-02-13 DIAGNOSIS — I341 Nonrheumatic mitral (valve) prolapse: Secondary | ICD-10-CM

## 2018-02-13 DIAGNOSIS — I712 Thoracic aortic aneurysm, without rupture, unspecified: Secondary | ICD-10-CM

## 2018-02-13 DIAGNOSIS — Z01818 Encounter for other preprocedural examination: Secondary | ICD-10-CM

## 2018-02-13 MED ORDER — IOPAMIDOL (ISOVUE-370) INJECTION 76%
75.0000 mL | Freq: Once | INTRAVENOUS | Status: AC | PRN
  Administered 2018-02-13: 75 mL via INTRAVENOUS

## 2018-02-14 LAB — CBC WITH DIFFERENTIAL/PLATELET
BASOS ABS: 0 10*3/uL (ref 0.0–0.2)
Basos: 1 %
EOS (ABSOLUTE): 0.3 10*3/uL (ref 0.0–0.4)
EOS: 4 %
Hematocrit: 38 % (ref 34.0–46.6)
Hemoglobin: 12.3 g/dL (ref 11.1–15.9)
IMMATURE GRANULOCYTES: 0 %
Immature Grans (Abs): 0 10*3/uL (ref 0.0–0.1)
Lymphocytes Absolute: 3 10*3/uL (ref 0.7–3.1)
Lymphs: 38 %
MCH: 32.3 pg (ref 26.6–33.0)
MCHC: 32.4 g/dL (ref 31.5–35.7)
MCV: 100 fL — ABNORMAL HIGH (ref 79–97)
MONOS ABS: 0.5 10*3/uL (ref 0.1–0.9)
Monocytes: 6 %
NEUTROS PCT: 51 %
Neutrophils Absolute: 4 10*3/uL (ref 1.4–7.0)
PLATELETS: 171 10*3/uL (ref 150–379)
RBC: 3.81 x10E6/uL (ref 3.77–5.28)
RDW: 12.9 % (ref 12.3–15.4)
WBC: 7.8 10*3/uL (ref 3.4–10.8)

## 2018-02-14 LAB — BASIC METABOLIC PANEL
BUN/Creatinine Ratio: 24 (ref 12–28)
BUN: 21 mg/dL (ref 8–27)
CHLORIDE: 102 mmol/L (ref 96–106)
CO2: 22 mmol/L (ref 20–29)
CREATININE: 0.87 mg/dL (ref 0.57–1.00)
Calcium: 9.8 mg/dL (ref 8.7–10.3)
GFR calc Af Amer: 80 mL/min/{1.73_m2} (ref >59)
GFR calc non Af Amer: 70 mL/min/{1.73_m2} (ref >59)
GLUCOSE: 67 mg/dL (ref 65–99)
POTASSIUM: 4.6 mmol/L (ref 3.5–5.2)
Sodium: 140 mmol/L (ref 134–144)

## 2018-02-14 LAB — PROTIME-INR
INR: 1 (ref 0.8–1.2)
PROTHROMBIN TIME: 10.7 s (ref 9.1–12.0)

## 2018-02-16 ENCOUNTER — Telehealth: Payer: Self-pay | Admitting: *Deleted

## 2018-02-16 NOTE — Telephone Encounter (Signed)
Pt contacted pre-catheterization scheduled at [facility] for: Monday Feb 20, 2018 7:30 AM Verified arrival time and place: [place] Entrance A at:  5:30 AM  No solid food after midnight prior to cath, clear liquids until 5 AM Verified allergies in Epic Verified no diabetes medications.   AM meds can be  taken pre-cath with sip of water including: ASA 81 mg  Confirmed patient has responsible person to drive home post procedure and observe patient for 24 hours: yes

## 2018-02-20 ENCOUNTER — Encounter (HOSPITAL_COMMUNITY): Payer: Self-pay | Admitting: Cardiovascular Disease

## 2018-02-20 ENCOUNTER — Ambulatory Visit (HOSPITAL_COMMUNITY)
Admission: RE | Admit: 2018-02-20 | Discharge: 2018-02-20 | Disposition: A | Discharge status: Home / Self Care | Payer: Medicare Other | Source: Ambulatory Visit | Attending: Cardiovascular Disease | Admitting: Cardiovascular Disease

## 2018-02-20 ENCOUNTER — Ambulatory Visit (HOSPITAL_COMMUNITY)
Admission: RE | Disposition: A | Discharge status: Home / Self Care | Payer: Medicare Other | Source: Ambulatory Visit | Attending: Cardiovascular Disease

## 2018-02-20 DIAGNOSIS — Z79890 Hormone replacement therapy (postmenopausal): Secondary | ICD-10-CM | POA: Diagnosis not present

## 2018-02-20 DIAGNOSIS — R0602 Shortness of breath: Secondary | ICD-10-CM | POA: Insufficient documentation

## 2018-02-20 DIAGNOSIS — Z8249 Family history of ischemic heart disease and other diseases of the circulatory system: Secondary | ICD-10-CM | POA: Insufficient documentation

## 2018-02-20 DIAGNOSIS — E8770 Fluid overload, unspecified: Secondary | ICD-10-CM | POA: Diagnosis not present

## 2018-02-20 DIAGNOSIS — Z9889 Other specified postprocedural states: Secondary | ICD-10-CM | POA: Insufficient documentation

## 2018-02-20 DIAGNOSIS — Z888 Allergy status to other drugs, medicaments and biological substances status: Secondary | ICD-10-CM | POA: Insufficient documentation

## 2018-02-20 DIAGNOSIS — I272 Pulmonary hypertension, unspecified: Secondary | ICD-10-CM | POA: Diagnosis not present

## 2018-02-20 DIAGNOSIS — Z79899 Other long term (current) drug therapy: Secondary | ICD-10-CM | POA: Insufficient documentation

## 2018-02-20 DIAGNOSIS — I340 Nonrheumatic mitral (valve) insufficiency: Secondary | ICD-10-CM | POA: Diagnosis not present

## 2018-02-20 DIAGNOSIS — E039 Hypothyroidism, unspecified: Secondary | ICD-10-CM | POA: Diagnosis not present

## 2018-02-20 DIAGNOSIS — D510 Vitamin B12 deficiency anemia due to intrinsic factor deficiency: Secondary | ICD-10-CM | POA: Insufficient documentation

## 2018-02-20 DIAGNOSIS — Z85828 Personal history of other malignant neoplasm of skin: Secondary | ICD-10-CM | POA: Diagnosis not present

## 2018-02-20 DIAGNOSIS — K589 Irritable bowel syndrome without diarrhea: Secondary | ICD-10-CM | POA: Diagnosis not present

## 2018-02-20 DIAGNOSIS — Z9049 Acquired absence of other specified parts of digestive tract: Secondary | ICD-10-CM | POA: Diagnosis not present

## 2018-02-20 HISTORY — PX: RIGHT/LEFT HEART CATH AND CORONARY ANGIOGRAPHY: CATH118266

## 2018-02-20 LAB — POCT I-STAT 3, VENOUS BLOOD GAS (G3P V)
ACID-BASE DEFICIT: 3 mmol/L — AB (ref 0.0–2.0)
BICARBONATE: 21.7 mmol/L (ref 20.0–28.0)
O2 Saturation: 75 %
TCO2: 23 mmol/L (ref 22–32)
pCO2, Ven: 37.9 mmHg — ABNORMAL LOW (ref 44.0–60.0)
pH, Ven: 7.365 (ref 7.250–7.430)
pO2, Ven: 41 mmHg (ref 32.0–45.0)

## 2018-02-20 LAB — POCT I-STAT 3, ART BLOOD GAS (G3+)
ACID-BASE DEFICIT: 2 mmol/L (ref 0.0–2.0)
BICARBONATE: 22.9 mmol/L (ref 20.0–28.0)
O2 SAT: 98 %
PCO2 ART: 40 mmHg (ref 32.0–48.0)
PO2 ART: 110 mmHg — AB (ref 83.0–108.0)
TCO2: 24 mmol/L (ref 22–32)
pH, Arterial: 7.366 (ref 7.350–7.450)

## 2018-02-20 SURGERY — RIGHT/LEFT HEART CATH AND CORONARY ANGIOGRAPHY
Anesthesia: LOCAL

## 2018-02-20 MED ORDER — SODIUM CHLORIDE 0.9 % IV SOLN: 250.0000 mL | INTRAVENOUS | Status: DC | PRN

## 2018-02-20 MED ORDER — HEPARIN (PORCINE) IN NACL 2-0.9 UNIT/ML-% IJ SOLN
INTRAMUSCULAR | Status: DC | PRN
  Administered 2018-02-20: 10 mL via INTRA_ARTERIAL

## 2018-02-20 MED ORDER — HEPARIN (PORCINE) IN NACL 1000-0.9 UT/500ML-% IV SOLN
INTRAVENOUS | Status: AC
  Filled 2018-02-20: qty 1000

## 2018-02-20 MED ORDER — ASPIRIN 81 MG PO CHEW
CHEWABLE_TABLET | ORAL | Status: AC
  Administered 2018-02-20: 81 mg via ORAL
  Filled 2018-02-20: qty 1

## 2018-02-20 MED ORDER — SODIUM CHLORIDE 0.9% FLUSH: 3.0000 mL | INTRAVENOUS | Status: DC | PRN

## 2018-02-20 MED ORDER — FENTANYL CITRATE (PF) 100 MCG/2ML IJ SOLN
INTRAMUSCULAR | Status: DC | PRN
  Administered 2018-02-20: 25 ug via INTRAVENOUS

## 2018-02-20 MED ORDER — LIDOCAINE HCL (PF) 1 % IJ SOLN
INTRAMUSCULAR | Status: AC
  Filled 2018-02-20: qty 30

## 2018-02-20 MED ORDER — MIDAZOLAM HCL 2 MG/2ML IJ SOLN
INTRAMUSCULAR | Status: DC | PRN
  Administered 2018-02-20: 2 mg via INTRAVENOUS

## 2018-02-20 MED ORDER — FUROSEMIDE 20 MG PO TABS: 20.0000 mg | ORAL_TABLET | Freq: Every day | ORAL | 11 refills | Status: DC

## 2018-02-20 MED ORDER — VERAPAMIL HCL 2.5 MG/ML IV SOLN
INTRAVENOUS | Status: AC
  Filled 2018-02-20: qty 2

## 2018-02-20 MED ORDER — SODIUM CHLORIDE 0.9% FLUSH: 3.0000 mL | Freq: Two times a day (BID) | INTRAVENOUS | Status: DC

## 2018-02-20 MED ORDER — ASPIRIN 81 MG PO CHEW
81.0000 mg | CHEWABLE_TABLET | ORAL | Status: AC
  Administered 2018-02-20: 81 mg via ORAL

## 2018-02-20 MED ORDER — HEPARIN SODIUM (PORCINE) 1000 UNIT/ML IJ SOLN
INTRAMUSCULAR | Status: DC | PRN
  Administered 2018-02-20: 3500 [IU] via INTRAVENOUS

## 2018-02-20 MED ORDER — HEPARIN SODIUM (PORCINE) 1000 UNIT/ML IJ SOLN
INTRAMUSCULAR | Status: AC
  Filled 2018-02-20: qty 1

## 2018-02-20 MED ORDER — MIDAZOLAM HCL 2 MG/2ML IJ SOLN
INTRAMUSCULAR | Status: AC
  Filled 2018-02-20: qty 2

## 2018-02-20 MED ORDER — SODIUM CHLORIDE 0.9 % WEIGHT BASED INFUSION
3.0000 mL/kg/h | INTRAVENOUS | Status: AC
  Administered 2018-02-20: 3 mL/kg/h via INTRAVENOUS

## 2018-02-20 MED ORDER — ONDANSETRON HCL 4 MG/2ML IJ SOLN: 4.0000 mg | Freq: Four times a day (QID) | INTRAMUSCULAR | Status: DC | PRN

## 2018-02-20 MED ORDER — FENTANYL CITRATE (PF) 100 MCG/2ML IJ SOLN
INTRAMUSCULAR | Status: AC
  Filled 2018-02-20: qty 2

## 2018-02-20 MED ORDER — HEPARIN (PORCINE) IN NACL 2-0.9 UNITS/ML
INTRAMUSCULAR | Status: AC | PRN
  Administered 2018-02-20 (×2): 500 mL

## 2018-02-20 MED ORDER — SODIUM CHLORIDE 0.9 % WEIGHT BASED INFUSION: 1.0000 mL/kg/h | INTRAVENOUS | Status: DC

## 2018-02-20 MED ORDER — IOHEXOL 350 MG/ML SOLN
INTRAVENOUS | Status: DC | PRN
  Administered 2018-02-20: 30 mL

## 2018-02-20 MED ORDER — LIDOCAINE HCL (PF) 1 % IJ SOLN
INTRAMUSCULAR | Status: DC | PRN
  Administered 2018-02-20 (×2): 2 mL

## 2018-02-20 MED ORDER — ACETAMINOPHEN 325 MG PO TABS: 650.0000 mg | ORAL_TABLET | ORAL | Status: DC | PRN

## 2018-02-20 MED ORDER — SODIUM CHLORIDE 0.9 % IV SOLN: INTRAVENOUS | Status: DC

## 2018-02-20 SURGICAL SUPPLY — 12 items
CATH BALLN WEDGE 5F 110CM (CATHETERS) ×1 IMPLANT
CATH INFINITI 5 FR JL3.5 (CATHETERS) ×1 IMPLANT
CATH INFINITI JR4 5F (CATHETERS) ×1 IMPLANT
DEVICE RAD COMP TR BAND LRG (VASCULAR PRODUCTS) ×1 IMPLANT
GLIDESHEATH SLEND SS 6F .021 (SHEATH) ×1 IMPLANT
GUIDEWIRE INQWIRE 1.5J.035X260 (WIRE) IMPLANT
INQWIRE 1.5J .035X260CM (WIRE) ×2
KIT HEART LEFT (KITS) ×2 IMPLANT
PACK CARDIAC CATHETERIZATION (CUSTOM PROCEDURE TRAY) ×2 IMPLANT
SHEATH GLIDE SLENDER 4/5FR (SHEATH) ×1 IMPLANT
TRANSDUCER W/STOPCOCK (MISCELLANEOUS) ×2 IMPLANT
TUBING CIL FLEX 10 FLL-RA (TUBING) ×2 IMPLANT

## 2018-02-20 NOTE — H&P (Signed)
Cardiology Admission History and Physical:   Patient ID: Tammy Montoya; MRN: [id number]; DOB: [birth year]   Admission date: 02/20/2018  Primary Care Provider: Elisabeth Cara, PA-C Primary Cardiologist: No primary care provider on file.  Referring: Dr Roxy Manns  Chief Complaint:  Shortness of Breath  Patient Profile:   Tammy Montoya is a 67 y.o. female with a history of severe mitral regurgitation presenting for preoperative heart catheterization  History of Present Illness:   Tammy Montoya has a history of severe primary mitral regurgitation, stage D1 disease, presenting for preoperative right and left heart catheterization before elective mitral valve repair.  The patient reports stable symptoms of shortness of breath.  She denies chest pain or other complaints.  She has no past history of coronary artery disease.   Past Medical History:  Diagnosis Date  . Basal cell carcinoma of right lower leg   . Fibrocystic disease of breast   . Hypothyroid   . IBS (irritable bowel syndrome)   . Mitral regurgitation   . MVP (mitral valve prolapse)    Murmur  . Pernicious anemia   . Simple endometrial hyperplasia without atypia     Past Surgical History:  Procedure Laterality Date  . APPENDECTOMY    . LAPAROSCOPIC CHOLECYSTECTOMY  03/2002  . TEE WITHOUT CARDIOVERSION N/A 12/09/2017   Procedure: TRANSESOPHAGEAL ECHOCARDIOGRAM (TEE);  Surgeon: Lelon Perla, MD;  Location: [facility] ENDOSCOPY;  Service: Cardiovascular;  Laterality: N/A;  . TONSILLECTOMY AND ADENOIDECTOMY       Medications Prior to Admission: Prior to Admission medications   Medication Sig Start Date End Date Taking? Authorizing Provider  azelastine (ASTELIN) 0.1 % nasal spray Place 1 spray into both nostrils daily as needed for allergies.  10/24/17  Yes [provider]  cetirizine (ZYRTEC) 10 MG tablet Take 10 mg by mouth daily.   Yes [provider]  Cholecalciferol (VITAMIN D) 2000 UNITS CAPS Take 2,000  Units by mouth daily.    Yes [provider]  cyanocobalamin (,VITAMIN B-12,) 1000 MCG/ML injection Inject 1,000 mcg into the muscle 3 (three) times a week.   Yes [provider]  esomeprazole (NEXIUM) 40 MG capsule Take 40 mg by mouth daily.  11/29/17  Yes [provider]  estazolam (PROSOM) 2 MG tablet Take 1-2 mg by mouth at bedtime as needed (for sleep).    Yes [provider]  estradiol (CLIMARA - DOSED IN MG/24 HR) 0.05 mg/24hr patch Place 1 patch (0.05 mg total) onto the skin once a week. Patient taking differently: Place 0.05 mg onto the skin every Sunday.  10/21/14  Yes Nunzio Cobbs, MD  ketotifen (ZADITOR) 0.025 % ophthalmic solution Place 1 drop into both eyes 2 (two) times daily as needed (for dry eyes).   Yes [provider]  levothyroxine (SYNTHROID, LEVOTHROID) 88 MCG tablet Take 88 mcg by mouth daily before breakfast.  01/09/18  Yes [provider]  liothyronine (CYTOMEL) 5 MCG tablet Take 15 mcg by mouth daily.  01/09/18  Yes [provider]  meloxicam (MOBIC) 15 MG tablet Take 15 mg by mouth daily as needed for pain.    Yes [provider]  Multiple Vitamins-Minerals (MULTIVITAL PO) Take 1 tablet by mouth daily.    Yes [provider]  nebivolol (BYSTOLIC) 2.5 MG tablet Take 1 tablet (2.5 mg total) by mouth daily. 01/13/18  Yes Lelon Perla, MD  progesterone (PROMETRIUM) 200 MG capsule Take 1 capsule (200 mg total) by mouth daily.  07/28/14  Yes Elveria Rising, MD  traMADol-acetaminophen (ULTRACET) 37.5-325 MG tablet Take 0.5-1 tablets by mouth every 6 (six) hours as needed for pain. 11/28/17  Yes [provider]  sodium chloride (OCEAN) 0.65 % SOLN nasal spray Place 1-2 sprays into both nostrils as needed for congestion.    [provider]     Allergies:    Allergies  Allergen Reactions  . Protonix [Pantoprazole] Itching    Social History:   Social History    Socioeconomic History  . Marital status: Married    Spouse name: Not on file  . Number of children: 2  . Years of education: Not on file  . Highest education level: Not on file  Occupational History  . Not on file  Social Needs  . Financial resource strain: Not on file  . Food insecurity:    Worry: Not on file    Inability: Not on file  . Transportation needs:    Medical: Not on file    Non-medical: Not on file  Tobacco Use  . Smoking status: Never Smoker  . Smokeless tobacco: Never Used  Substance and Sexual Activity  . Alcohol use: No    Frequency: Never  . Drug use: No  . Sexual activity: Not on file  Lifestyle  . Physical activity:    Days per week: Not on file    Minutes per session: Not on file  . Stress: Not on file  Relationships  . Social connections:    Talks on phone: Not on file    Gets together: Not on file    Attends religious service: Not on file    Active member of club or organization: Not on file    Attends meetings of clubs or organizations: Not on file    Relationship status: Not on file  . Intimate partner violence:    Fear of current or ex partner: Not on file    Emotionally abused: Not on file    Physically abused: Not on file    Forced sexual activity: Not on file  Other Topics Concern  . Not on file  Social History Narrative  . Not on file    Family History:   The patient's family history includes Diabetes in her brother, father, and paternal grandmother; Heart attack in her father; Heart failure in her brother; Osteoporosis in her mother.    ROS:  Please see the history of present illness.  All other ROS reviewed and negative.     Physical Exam/Data:   Vitals:   02/20/18 0549  BP: 126/66  Pulse: (!) 51  Resp: 18  Temp: 98.2 F (36.8 C)  TempSrc: Oral  SpO2: 100%  Weight: 150 lb (68 kg)  Height: 5\' 9"  (1.753 m)   No intake or output data in the 24 hours ending 02/20/18 0724 Filed Weights   02/20/18 0549  Weight: 150  lb (68 kg)   Body mass index is 22.15 kg/m.  Previously documented by Dr Roxy Manns and Dr Stanford Breed - no changes to report.  Laboratory Data:  Chemistry Recent Labs  Lab 02/13/18 1524  NA 140  K 4.6  CL 102  CO2 22  GLUCOSE 67  BUN 21  CREATININE 0.87  CALCIUM 9.8  GFRNONAA 70  GFRAA 80    No results for input(s): PROT, ALBUMIN, AST, ALT, ALKPHOS, BILITOT in the last 168 hours. Hematology Recent Labs  Lab 02/13/18 1524  WBC 7.8  RBC 3.81  HGB 12.3  HCT 38.0  MCV 100*  MCH 32.3  MCHC 32.4  RDW 12.9  PLT 171   Cardiac EnzymesNo results for input(s): TROPONINI in the last 168 hours. No results for input(s): TROPIPOC in the last 168 hours.  BNPNo results for input(s): BNP, PROBNP in the last 168 hours.  DDimer No results for input(s): DDIMER in the last 168 hours.  Radiology/Studies:  No results found.  Assessment and Plan:   Severe primary mitral regurgitation secondary to mitral valve prolapse: Plan right and left heart catheterization for preoperative assessment before elective mitral valve repair.  Risks, indications, and alternatives reviewed with the patient.  She understands and agrees to proceed.  All of her questions were answered.  Preoperative labs and CT angiogram of the chest, abdomen, and pelvis are reviewed with normal findings noted.  For questions or updates, please contact [place] Please consult [email or web address] for contact info under Cardiology/STEMI.    Signed, Sherren Mocha, MD  02/20/2018 7:24 AM

## 2018-02-20 NOTE — Progress Notes (Signed)
R brachial sheath pulled at 0830. Pressure held for 10 minutes. Level 0, VSS.

## 2018-02-20 NOTE — Discharge Instructions (Signed)
Drink plenty of fluids over next 48 hours and keep right wrist elevated at heart level for 24 hours ° °Radial Site Care °Refer to this sheet in the next few weeks. These instructions provide you with information about caring for yourself after your procedure. Your health care provider may also give you more specific instructions. Your treatment has been planned according to current medical practices, but problems sometimes occur. Call your health care provider if you have any problems or questions after your procedure. °What can I expect after the procedure? °After your procedure, it is typical to have the following: °· Bruising at the radial site that usually fades within 1-2 weeks. °· Blood collecting in the tissue (hematoma) that may be painful to the touch. It should usually decrease in size and tenderness within 1-2 weeks. ° °Follow these instructions at home: °· Take medicines only as directed by your health care provider. °· You may shower 24-48 hours after the procedure or as directed by your health care provider. Remove the bandage (dressing) and gently wash the site with plain soap and water. Pat the area dry with a clean towel. Do not rub the site, because this may cause bleeding. °· Do not take baths, swim, or use a hot tub until your health care provider approves. °· Check your insertion site every day for redness, swelling, or drainage. °· Do not apply powder or lotion to the site. °· Do not flex or bend the affected arm for 24 hours or as directed by your health care provider. °· Do not push or pull heavy objects with the affected arm for 24 hours or as directed by your health care provider. °· Do not lift over 10 lb (4.5 kg) for 5 days after your procedure or as directed by your health care provider. °· Ask your health care provider when it is okay to: °? Return to work or school. °? Resume usual physical activities or sports. °? Resume sexual activity. °· Do not drive home if you are discharged the  same day as the procedure. Have someone else drive you. °· You may drive 24 hours after the procedure unless otherwise instructed by your health care provider. °· Do not operate machinery or power tools for 24 hours after the procedure. °· If your procedure was done as an outpatient procedure, which means that you went home the same day as your procedure, a responsible adult should be with you for the first 24 hours after you arrive home. °· Keep all follow-up visits as directed by your health care provider. This is important. °Contact a health care provider if: °· You have a fever. °· You have chills. °· You have increased bleeding from the radial site. Hold pressure on the site. °Get help right away if: °· You have unusual pain at the radial site. °· You have redness, warmth, or swelling at the radial site. °· You have drainage (other than a small amount of blood on the dressing) from the radial site. °· The radial site is bleeding, and the bleeding does not stop after 30 minutes of holding steady pressure on the site. °· Your arm or hand becomes pale, cool, tingly, or numb. °This information is not intended to replace advice given to you by your health care provider. Make sure you discuss any questions you have with your health care provider. °Document Released: 11/06/2010 Document Revised: 03/11/2016 Document Reviewed: 04/22/2014 °Elsevier Interactive Patient Education © 2018 [employer] ° °

## 2018-02-21 ENCOUNTER — Other Ambulatory Visit: Payer: Medicare Other

## 2018-02-21 MED FILL — Heparin Sod (Porcine)-NaCl IV Soln 1000 Unit/500ML-0.9%: INTRAVENOUS | Qty: 1000 | Status: AC

## 2018-02-24 NOTE — Pre-Procedure Instructions (Signed)
Tammy Montoya  02/24/2018      [facility] - [place], [place] - [address] [place] [address] Phone: [phone] Fax: [phone]    Your procedure is scheduled on Wed. May 15  Report to [facility] Admitting at 6:30 A.M.  Call this number if you have problems the morning of surgery:  [phone]   Remember:  Do not eat food or drink liquids after midnight on Tues. May 14   Take these medicines the morning of surgery with A SIP OF WATER : nasal spray if needed,cetirizine (zyrtec), esomeprazole (nexium), eye drops, levothyroxine (synthroid), liothyroxine (cytomel), nebivolol (bystolic), ultracet if needed               7 days prior to surgery STOP taking any Aspirin(unless otherwise instructed by your surgeon), Aleve, Naproxen, Ibuprofen, Motrin, Advil, Goody's, BC's, all herbal medications, fish oil, and all vitamins   Do not wear jewelry, make-up or nail polish.  Do not wear lotions, powders, or perfumes, or deodorant.  Do not shave 48 hours prior to surgery.  Men may shave face and neck.  Do not bring valuables to the hospital.  [facility] is not responsible for any belongings or valuables.  Contacts, dentures or bridgework may not be worn into surgery.  Leave your suitcase in the car.  After surgery it may be brought to your room.  For patients admitted to the hospital, discharge time will be determined by your treatment team.  Patients discharged the day of surgery will not be allowed to drive home.    Special instructions:   [place]- Preparing For Surgery  Before surgery, you can play an important role. Because skin is not sterile, your skin needs to be as free of germs as possible. You can reduce the number of germs on your skin by washing with CHG (chlorahexidine gluconate) Soap before surgery.  CHG is an antiseptic cleaner which kills germs and bonds with the skin to continue killing germs even after  washing.  Oral Hygiene is also important to reduce your risk of infection.  Remember - BRUSH YOUR TEETH THE MORNING OF SURGERY  Please do not use if you have an allergy to CHG or antibacterial soaps. If your skin becomes reddened/irritated stop using the CHG.  Do not shave (including legs and underarms) for at least 48 hours prior to first CHG shower. It is OK to shave your face.  Please follow these instructions carefully.   1. Shower the NIGHT BEFORE SURGERY and the MORNING OF SURGERY with CHG.   2. If you chose to wash your hair, wash your hair first as usual with your normal shampoo.  3. After you shampoo, rinse your hair and body thoroughly to remove the shampoo.  4. Use CHG as you would any other liquid soap. You can apply CHG directly to the skin and wash gently with a scrungie or a clean washcloth.   5. Apply the CHG Soap to your body ONLY FROM THE NECK DOWN.  Do not use on open wounds or open sores. Avoid contact with your eyes, ears, mouth and genitals (private parts). Wash Face and genitals (private parts)  with your normal soap.  6. Wash thoroughly, paying special attention to the area where your surgery will be performed.  7. Thoroughly rinse your body with warm water from the neck down.  8. DO NOT shower/wash with your normal soap after using and rinsing  off the CHG Soap.  9. Pat yourself dry with a CLEAN TOWEL.  10. Wear CLEAN PAJAMAS to bed the night before surgery, wear comfortable clothes the morning of surgery  11. Place CLEAN SHEETS on your bed the night of your first shower and DO NOT SLEEP WITH PETS.    Day of Surgery: Do not apply any deodorants/lotions. Please wear clean clothes to the hospital/surgery center.  Remember to brush your teeth.      Please read over the following fact sheets that you were given. Coughing and Deep Breathing, MRSA Information and Surgical Site Infection Prevention

## 2018-02-27 ENCOUNTER — Other Ambulatory Visit: Payer: Self-pay

## 2018-02-27 ENCOUNTER — Encounter (HOSPITAL_COMMUNITY): Payer: Self-pay

## 2018-02-27 ENCOUNTER — Ambulatory Visit (HOSPITAL_COMMUNITY)
Admission: RE | Admit: 2018-02-27 | Discharge: 2018-02-27 | Disposition: A | Discharge status: Home / Self Care | Payer: Medicare Other | Source: Ambulatory Visit | Attending: Thoracic Surgery (Cardiothoracic Vascular Surgery) | Admitting: Thoracic Surgery (Cardiothoracic Vascular Surgery)

## 2018-02-27 ENCOUNTER — Ambulatory Visit: Payer: Medicare Other | Admitting: Thoracic Surgery (Cardiothoracic Vascular Surgery)

## 2018-02-27 ENCOUNTER — Ambulatory Visit (HOSPITAL_BASED_OUTPATIENT_CLINIC_OR_DEPARTMENT_OTHER)
Admission: RE | Admit: 2018-02-27 | Discharge: 2018-02-27 | Disposition: A | Discharge status: Home / Self Care | Payer: Medicare Other | Source: Ambulatory Visit | Attending: Thoracic Surgery (Cardiothoracic Vascular Surgery) | Admitting: Thoracic Surgery (Cardiothoracic Vascular Surgery)

## 2018-02-27 ENCOUNTER — Encounter (HOSPITAL_COMMUNITY)
Admission: RE | Admit: 2018-02-27 | Discharge: 2018-02-27 | Disposition: A | Discharge status: Home / Self Care | Payer: Medicare Other | Source: Ambulatory Visit | Attending: Thoracic Surgery (Cardiothoracic Vascular Surgery) | Admitting: Thoracic Surgery (Cardiothoracic Vascular Surgery)

## 2018-02-27 ENCOUNTER — Encounter: Payer: Self-pay | Admitting: Thoracic Surgery (Cardiothoracic Vascular Surgery)

## 2018-02-27 VITALS — BP 108/72 | HR 87 | Resp 20 | Ht 69.0 in | Wt 157.0 lb

## 2018-02-27 DIAGNOSIS — I712 Thoracic aortic aneurysm, without rupture, unspecified: Secondary | ICD-10-CM

## 2018-02-27 DIAGNOSIS — J449 Chronic obstructive pulmonary disease, unspecified: Secondary | ICD-10-CM | POA: Insufficient documentation

## 2018-02-27 DIAGNOSIS — I341 Nonrheumatic mitral (valve) prolapse: Secondary | ICD-10-CM

## 2018-02-27 DIAGNOSIS — I340 Nonrheumatic mitral (valve) insufficiency: Secondary | ICD-10-CM

## 2018-02-27 DIAGNOSIS — I771 Stricture of artery: Secondary | ICD-10-CM | POA: Insufficient documentation

## 2018-02-27 DIAGNOSIS — Z01812 Encounter for preprocedural laboratory examination: Secondary | ICD-10-CM

## 2018-02-27 DIAGNOSIS — Z01818 Encounter for other preprocedural examination: Secondary | ICD-10-CM | POA: Insufficient documentation

## 2018-02-27 HISTORY — DX: Rheumatic fever without heart involvement: I00

## 2018-02-27 HISTORY — DX: Dyspnea, unspecified: R06.00

## 2018-02-27 HISTORY — DX: Sleep apnea, unspecified: G47.30

## 2018-02-27 LAB — COMPREHENSIVE METABOLIC PANEL
ALT: 16 U/L (ref 14–54)
AST: 28 U/L (ref 15–41)
Albumin: 4.3 g/dL (ref 3.5–5.0)
Alkaline Phosphatase: 64 U/L (ref 38–126)
Anion gap: 11 (ref 5–15)
BILIRUBIN TOTAL: 1.2 mg/dL (ref 0.3–1.2)
BUN: 19 mg/dL (ref 6–20)
CALCIUM: 9.7 mg/dL (ref 8.9–10.3)
CO2: 20 mmol/L — ABNORMAL LOW (ref 22–32)
CREATININE: 1.02 mg/dL — AB (ref 0.44–1.00)
Chloride: 106 mmol/L (ref 101–111)
GFR, EST NON AFRICAN AMERICAN: 56 mL/min — AB (ref >60)
Glucose, Bld: 90 mg/dL (ref 65–99)
Potassium: 4.4 mmol/L (ref 3.5–5.1)
Sodium: 137 mmol/L (ref 135–145)
TOTAL PROTEIN: 7.1 g/dL (ref 6.5–8.1)

## 2018-02-27 LAB — CBC
HEMATOCRIT: 41 % (ref 36.0–46.0)
Hemoglobin: 13.5 g/dL (ref 12.0–15.0)
MCH: 33.3 pg (ref 26.0–34.0)
MCHC: 32.9 g/dL (ref 30.0–36.0)
MCV: 101.2 fL — ABNORMAL HIGH (ref 78.0–100.0)
PLATELETS: 141 10*3/uL — AB (ref 150–400)
RBC: 4.05 MIL/uL (ref 3.87–5.11)
RDW: 13.2 % (ref 11.5–15.5)
WBC: 6.6 10*3/uL (ref 4.0–10.5)

## 2018-02-27 LAB — PULMONARY FUNCTION TEST
DL/VA % pred: 65 %
DL/VA: 3.48 ml/min/mmHg/L
DLCO UNC % PRED: 60 %
DLCO unc: 18.79 ml/min/mmHg
FEF 25-75 POST: 1.31 L/s
FEF 25-75 PRE: 1.16 L/s
FEF2575-%CHANGE-POST: 12 %
FEF2575-%PRED-POST: 55 %
FEF2575-%Pred-Pre: 49 %
FEV1-%CHANGE-POST: 5 %
FEV1-%PRED-POST: 82 %
FEV1-%Pred-Pre: 77 %
FEV1-PRE: 2.23 L
FEV1-Post: 2.35 L
FEV1FVC-%CHANGE-POST: 5 %
FEV1FVC-%Pred-Pre: 84 %
FEV6-%CHANGE-POST: 0 %
FEV6-%Pred-Post: 91 %
FEV6-%Pred-Pre: 90 %
FEV6-Post: 3.28 L
FEV6-Pre: 3.26 L
FEV6FVC-%Change-Post: 0 %
FEV6FVC-%Pred-Post: 100 %
FEV6FVC-%Pred-Pre: 99 %
FVC-%CHANGE-POST: 0 %
FVC-%PRED-POST: 90 %
FVC-%Pred-Pre: 90 %
FVC-PRE: 3.41 L
FVC-Post: 3.41 L
POST FEV1/FVC RATIO: 69 %
Post FEV6/FVC ratio: 96 %
Pre FEV1/FVC ratio: 65 %
Pre FEV6/FVC Ratio: 96 %
RV % pred: 149 %
RV: 3.55 L
TLC % pred: 117 %
TLC: 6.84 L

## 2018-02-27 LAB — URINALYSIS, ROUTINE W REFLEX MICROSCOPIC
Bilirubin Urine: NEGATIVE
Glucose, UA: NEGATIVE mg/dL
HGB URINE DIPSTICK: NEGATIVE
Ketones, ur: NEGATIVE mg/dL
LEUKOCYTES UA: NEGATIVE
NITRITE: NEGATIVE
PROTEIN: NEGATIVE mg/dL
SPECIFIC GRAVITY, URINE: 1.003 — AB (ref 1.005–1.030)
pH: 5 (ref 5.0–8.0)

## 2018-02-27 LAB — BLOOD GAS, ARTERIAL
ACID-BASE DEFICIT: 0.3 mmol/L (ref 0.0–2.0)
Bicarbonate: 23.2 mmol/L (ref 20.0–28.0)
DRAWN BY: 470591
FIO2: 21
O2 SAT: 98.5 %
PCO2 ART: 34.1 mmHg (ref 32.0–48.0)
PH ART: 7.448 (ref 7.350–7.450)
PO2 ART: 123 mmHg — AB (ref 83.0–108.0)
Patient temperature: 98.6

## 2018-02-27 LAB — APTT: aPTT: 20 seconds — ABNORMAL LOW (ref 24–36)

## 2018-02-27 LAB — ABO/RH: ABO/RH(D): AB POS

## 2018-02-27 LAB — PROTIME-INR
INR: 0.95
Prothrombin Time: 12.6 seconds (ref 11.4–15.2)

## 2018-02-27 LAB — HEMOGLOBIN A1C
Hgb A1c MFr Bld: 5.1 % (ref 4.8–5.6)
MEAN PLASMA GLUCOSE: 99.67 mg/dL

## 2018-02-27 LAB — SURGICAL PCR SCREEN
MRSA, PCR: NEGATIVE
STAPHYLOCOCCUS AUREUS: NEGATIVE

## 2018-02-27 MED ORDER — ALBUTEROL SULFATE (2.5 MG/3ML) 0.083% IN NEBU
2.5000 mg | INHALATION_SOLUTION | Freq: Once | RESPIRATORY_TRACT | Status: AC
  Administered 2018-02-27: 2.5 mg via RESPIRATORY_TRACT

## 2018-02-27 NOTE — Progress Notes (Signed)
[address]       [place] [address]             [phone]     CARDIOTHORACIC SURGERY OFFICE NOTE  Referring Provider is Stanford Breed, Denice Bors, MD PCP is L[place], [place], [place]   HPI:  Patient is a 67 year old female with history of mitral valve prolapse with severe mitral regurgitation, rheumatic fever during childhood, frequent PVCs and palpitations, obstructive sleep apnea, and hypothyroidism who returns to the office today for follow-up with tentative plan to proceed with minimally invasive mitral valve repair later this week.  She was originally seen in consultation on January 18, 2018.  Since then she underwent diagnostic cardiac catheterization and CT angiography.  She returns the office today to review the results of these tests and make final plans for surgery.  She reports no new problems or complaints.   Current Outpatient Medications  Medication Sig Dispense Refill  . azelastine (ASTELIN) 0.1 % nasal spray Place 1 spray into both nostrils daily as needed for allergies.     . cetirizine (ZYRTEC) 10 MG tablet Take 10 mg by mouth daily.    . Cholecalciferol (VITAMIN D) 2000 UNITS CAPS Take 2,000 Units by mouth daily.     . cyanocobalamin (,VITAMIN B-12,) 1000 MCG/ML injection Inject 1,000 mcg into the muscle 3 (three) times a week.    . esomeprazole (NEXIUM) 40 MG capsule Take 40 mg by mouth as needed.     . estazolam (PROSOM) 2 MG tablet Take 1-2 mg by mouth at bedtime as needed (for sleep).     Marland Kitchen estradiol (CLIMARA - DOSED IN MG/24 HR) 0.05 mg/24hr patch Place 1 patch (0.05 mg total) onto the skin once a week. (Patient taking differently: Place 0.05 mg onto the skin every Sunday. ) 4 patch 1  . furosemide (LASIX) 20 MG tablet Take 1 tablet (20 mg total) by mouth daily. 30 tablet 11  . ketotifen (ZADITOR) 0.025 % ophthalmic solution Place 1 drop into both eyes 2 (two) times daily as needed (for dry eyes).    Marland Kitchen levothyroxine (SYNTHROID, LEVOTHROID) 88  MCG tablet Take 88 mcg by mouth daily before breakfast.     . liothyronine (CYTOMEL) 5 MCG tablet Take 15 mcg by mouth daily.     . meloxicam (MOBIC) 15 MG tablet Take 15 mg by mouth daily as needed for pain.     . Multiple Vitamins-Minerals (MULTIVITAL PO) Take 1 tablet by mouth daily.     . nebivolol (BYSTOLIC) 2.5 MG tablet Take 1 tablet (2.5 mg total) by mouth daily. 90 tablet 3  . progesterone (PROMETRIUM) 200 MG capsule Take 1 capsule (200 mg total) by mouth daily. 90 capsule 0  . sodium chloride (OCEAN) 0.65 % SOLN nasal spray Place 1-2 sprays into both nostrils as needed for congestion.    . traMADol-acetaminophen (ULTRACET) 37.5-325 MG tablet Take 0.5-1 tablets by mouth every 6 (six) hours as needed for pain.     No current facility-administered medications for this visit.       Physical Exam:   BP 108/72   Pulse 87   Resp 20   Ht 5\' 9"  (1.753 m)   Wt 157 lb (71.2 kg)   LMP 09/17/2012   SpO2 97% Comment: RA  BMI 23.18 kg/m   General:  Well-appearing  Chest:   Clear to auscultation  CV:   Regular rate and rhythm with holosystolic murmur  Incisions:  n/a  Abdomen:  Soft nontender  Extremities:  Warm and well-perfused  Diagnostic Tests:  RIGHT/LEFT HEART CATH AND CORONARY ANGIOGRAPHY  Conclusion     Hemodynamic findings consistent with moderate pulmonary hypertension.   1. Hemodynamic findings consistent with severe mitral regurgitation, including large V-waves and pulmonary HTN 2. Widely patent coronary arteries without obstructive disease  Plan: Continued evaluation for mitral valve repair. Start low-dose diuretic Rx.    Indications   Severe mitral insufficiency [I34.0 (ICD-10-CM)]  Procedural Details/Technique   Technical Details INDICATION: Severe mitral regurgitation, pre-op study  PROCEDURAL DETAILS: There was an indwelling IV in a right antecubital vein. Using normal sterile technique, the IV was changed out for a 5 Fr brachial sheath over a 0.018  inch wire. The right wrist was then prepped, draped, and anesthetized with 1% lidocaine. Using the modified Seldinger technique a 5/6 French Slender sheath was placed in the right radial artery. Intra-arterial verapamil was administered through the radial artery sheath. IV heparin was administered after a JR4 catheter was advanced into the central aorta. A Swan-Ganz catheter was used for the right heart catheterization. Standard protocol was followed for recording of right heart pressures and sampling of oxygen saturations. Fick cardiac output was calculated. Standard Judkins catheters were used for selective coronary angiography and the JR4 is used to record LV pressure and Ao pullback. There were no immediate procedural complications. The patient was transferred to the post catheterization recovery area for further monitoring.     Estimated blood loss <50 mL.  During this procedure the patient was administered the following to achieve and maintain moderate conscious sedation: Versed 2 mg, Fentanyl 25 mcg, while the patient's heart rate, blood pressure, and oxygen saturation were continuously monitored. The period of conscious sedation was 31 minutes, of which I was present face-to-face 100% of this time.  Coronary Findings   Diagnostic  Dominance: Right  Left Anterior Descending  Vessel is large. Vessel is angiographically normal.  Ramus Intermedius  Vessel is moderate in size. Vessel is angiographically normal.  Left Circumflex  First Obtuse Marginal Branch  Vessel is small in size.  Second Obtuse Marginal Branch  Vessel is small in size.  Right Coronary Artery  Vessel is large. Vessel is angiographically normal.  Intervention   No interventions have been documented.  Right Heart   Right Heart Pressures Hemodynamic findings consistent with moderate pulmonary hypertension. Elevated LV EDP consistent with volume overload.  Coronary Diagrams   Diagnostic Diagram       Implants      No implant documentation for this case.  MERGE Images   Show images for CARDIAC CATHETERIZATION   Link to Procedure Log   Procedure Log    Hemo Data    Most Recent Value  Fick Cardiac Output 6.29 L/min  Fick Cardiac Output Index 3.46 (L/min)/BSA  RA A Wave 21 mmHg  RA V Wave 18 mmHg  RA Mean 17 mmHg  RV Systolic Pressure 52 mmHg  RV Diastolic Pressure 10 mmHg  RV EDP 21 mmHg  PA Systolic Pressure 50 mmHg  PA Diastolic Pressure 23 mmHg  PA Mean 35 mmHg  PW A Wave 27 mmHg  PW V Wave 39 mmHg  PW Mean 24 mmHg  AO Systolic Pressure 983 mmHg  AO Diastolic Pressure 69 mmHg  AO Mean 96 mmHg  LV Systolic Pressure 382 mmHg  LV Diastolic Pressure 12 mmHg  LV EDP 28 mmHg  Arterial Occlusion Pressure Extended Systolic Pressure 505 mmHg  Arterial Occlusion Pressure Extended Diastolic Pressure 67  mmHg  Arterial Occlusion Pressure Extended Mean Pressure 93 mmHg  Left Ventricular Apex Extended Systolic Pressure 268 mmHg  Left Ventricular Apex Extended Diastolic Pressure 10 mmHg  Left Ventricular Apex Extended EDP Pressure 24 mmHg  QP/QS 1  TPVR Index 10.13 HRUI  TSVR Index 27.76 HRUI  PVR SVR Ratio 0.14  TPVR/TSVR Ratio 0.36     CT ANGIOGRAPHY CHEST, ABDOMEN AND PELVIS  TECHNIQUE: Multidetector CT imaging through the chest, abdomen and pelvis was performed using the standard protocol during bolus administration of intravenous contrast. Multiplanar reconstructed images and MIPs were obtained and reviewed to evaluate the vascular anatomy.  CONTRAST:  66mL ISOVUE-370 IOPAMIDOL (ISOVUE-370) INJECTION 76%  COMPARISON:  None.  FINDINGS: CTA CHEST FINDINGS  Cardiovascular: Four-chamber cardiac enlargement. Incomplete opacification of the pulmonary arterial tree; the exam was not optimized for detection of pulmonary emboli. Adequate contrast opacification of the thoracic aorta with no evidence of dissection, aneurysm, or stenosis. There is classic 3-vessel  brachiocephalic arch anatomy without proximal stenosis. No significant atheromatous irregularity.  Mediastinum/Nodes: No enlarged mediastinal, hilar, or axillary lymph nodes. Calcified right hilar lymph nodes. Thyroid gland, trachea, and esophagus demonstrate no significant findings. No pericardial effusion.  Lungs/Pleura: Lungs are clear. No pleural effusion or pneumothorax.  Musculoskeletal: No chest wall abnormality. No acute or significant osseous findings.  Review of the MIP images confirms the above findings.  CTA ABDOMEN AND PELVIS FINDINGS  VASCULAR  Aorta: Normal caliber aorta without aneurysm, dissection, vasculitis or significant stenosis. No significant atheromatous plaque.  Celiac: Patent without evidence of aneurysm, dissection, vasculitis or significant stenosis.  SMA: Patent without evidence of aneurysm, dissection, vasculitis or significant stenosis.  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 3 left renal arteries, superior dominant, all appearing widely patent.  IMA: Patent without evidence of aneurysm, dissection, vasculitis or significant stenosis.  Inflow: Patent without evidence of aneurysm, dissection, vasculitis or significant stenosis. No significant atheromatous irregularity. Minimal tortuosity.  Veins: No obvious venous abnormality within the limitations of this arterial phase study.  Review of the MIP images confirms the above findings.  NON-VASCULAR  Hepatobiliary: No focal liver abnormality is seen. Status post cholecystectomy. No biliary dilatation.  Pancreas: Unremarkable. No pancreatic ductal dilatation or surrounding inflammatory changes.  Spleen: Normal in size. Small calcified granulomas. No other focal abnormality.  Adrenals/Urinary Tract: Normal adrenal glands. 3.5 cm cyst from the lower pole left kidney. No hydronephrosis. Urinary bladder physiologically  distended.  Stomach/Bowel: Stomach and small bowel are decompressed. Appendix not discretely identified. Colon is nondilated, unremarkable.  Lymphatic: No abdominal or pelvic adenopathy.  Reproductive: Uterus and bilateral adnexa are unremarkable.  Other: No ascites.  No free air.  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 Negative for fracture or worrisome bone lesion.  Review of the MIP images confirms the above findings.  IMPRESSION: 1. No acute findings. 2. Negative for thoracic or abdominal aortic aneurysm or dissection. 3. No significant  aortoiliac occlusive disease or tortuosity.   Electronically Signed   By: Lucrezia Europe M.D.   On: 02/13/2018 15:41   Impression:  Patient has mitral valve prolapse with stage D severe symptomatic primary mitral regurgitation.  She presents with recent progression of symptoms of exertional shortness of breath, atypical chest pain, frequent palpitations, and occasional dizzy spells.  I have personally reviewed the patient's recent transthoracic and transesophageal echocardiograms, diagnostic cardiac catheterization, and CT angiogram.  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  Left ventricular systolic function remains normal.  I agree the patient needs elective mitral valve repair.  Catheterization is notable for the absence of significant coronary artery disease.  CT angiography reveals no contraindication to peripheral cannulation for surgery.   Plan:  The patient and her husband were again counseled at length regarding the indications, risks and potential benefits of mitral valve repair.  The rationale for elective surgery has been explained, including a comparison between surgery and continued medical  therapy with close follow-up.  The likelihood of successful and durable valve repair has been discussed with particular reference to the findings of their recent echocardiogram.  Based upon these findings and previous experience, I have quoted them a greater than 95 percent likelihood of successful valve repair.  Alternative surgical approaches have been discussed including a comparison between conventional sternotomy and minimally-invasive techniques.  The relative risks and benefits of each have been reviewed as they pertain to the patient's specific circumstances, and all of their questions have been addressed.  Expectations for the patient's postoperative convalescence have been discussed.  The patient understands and accepts all potential risks of surgery including but not limited to risk of death, stroke or other neurologic complication, myocardial infarction, congestive heart failure, respiratory failure, renal failure, bleeding requiring transfusion and/or reexploration, arrhythmia, infection or other wound complications, pneumonia, pleural and/or pericardial effusion, pulmonary embolus, aortic dissection or other major vascular complication, or delayed complications related to valve repair or replacement including but not limited to structural valve deterioration and failure, thrombosis, embolization, endocarditis, or paravalvular leak.  Specific risks potentially related to the minimally-invasive approach were discussed at length, including but not limited to risk of conversion to full or partial sternotomy, aortic dissection or other major vascular complication, unilateral acute lung injury or pulmonary edema, phrenic nerve dysfunction or paralysis, rib fracture, chronic pain, lung hernia, or lymphocele. All of their questions have been answered.     I spent in excess of 30 minutes during the conduct of this office consultation and >50% of this time involved direct face-to-face encounter with the  patient for counseling and/or coordination of their care.   [name], MD 02/27/2018 1:45 PM

## 2018-02-27 NOTE — Patient Instructions (Signed)
   Continue taking all current medications without change through the day before surgery.  Have nothing to eat or drink after midnight the night before surgery.  On the morning of surgery take only Nexium and Synthroid with a sip of water.

## 2018-02-27 NOTE — Progress Notes (Signed)
PCP: Vedia Coffer, PA-C @ [facility]  Cardiologist: Dr. Stanford Breed

## 2018-02-27 NOTE — Progress Notes (Signed)
Pre-op Cardiac Surgery  Carotid Findings:  No significant ICA stenosis. Vertebral artery flow is antegrade.   Upper Extremity Right Left  Brachial Pressures 117T 118T  Radial Waveforms T T  Ulnar Waveforms T T  Palmar Arch (Allen's Test) WNL Doppler signal remains normal with radial compression and obliterates with ulnar compression.

## 2018-02-28 MED ORDER — VANCOMYCIN HCL 10 G IV SOLR
1250.0000 mg | INTRAVENOUS | Status: AC
  Administered 2018-03-01: 1250 mg via INTRAVENOUS
  Filled 2018-02-28: qty 1250

## 2018-02-28 MED ORDER — KENNESTONE BLOOD CARDIOPLEGIA VIAL
13.0000 mL | Freq: Once | Status: DC
  Filled 2018-02-28: qty 13

## 2018-02-28 MED ORDER — SODIUM CHLORIDE 0.9 % IV SOLN
750.0000 mg | INTRAVENOUS | Status: DC
  Filled 2018-02-28: qty 750

## 2018-02-28 MED ORDER — NITROGLYCERIN IN D5W 200-5 MCG/ML-% IV SOLN
2.0000 ug/min | INTRAVENOUS | Status: AC
  Administered 2018-03-01: 16.6 ug/min via INTRAVENOUS
  Filled 2018-02-28: qty 250

## 2018-02-28 MED ORDER — PHENYLEPHRINE HCL 10 MG/ML IJ SOLN
30.0000 ug/min | INTRAMUSCULAR | Status: DC
  Filled 2018-02-28: qty 2

## 2018-02-28 MED ORDER — TRANEXAMIC ACID 1000 MG/10ML IV SOLN
1.5000 mg/kg/h | INTRAVENOUS | Status: DC
  Filled 2018-02-28: qty 25

## 2018-02-28 MED ORDER — INSULIN REGULAR HUMAN 100 UNIT/ML IJ SOLN
INTRAMUSCULAR | Status: DC
  Filled 2018-02-28: qty 1

## 2018-02-28 MED ORDER — DOPAMINE-DEXTROSE 3.2-5 MG/ML-% IV SOLN
0.0000 ug/kg/min | INTRAVENOUS | Status: DC
  Filled 2018-02-28: qty 250

## 2018-02-28 MED ORDER — GLUTARALDEHYDE 0.625% SOAKING SOLUTION
Freq: Once | TOPICAL | Status: DC
  Filled 2018-02-28 (×2): qty 50

## 2018-02-28 MED ORDER — MILRINONE LACTATE IN DEXTROSE 20-5 MG/100ML-% IV SOLN
0.1250 ug/kg/min | INTRAVENOUS | Status: DC
  Filled 2018-02-28: qty 100

## 2018-02-28 MED ORDER — DEXMEDETOMIDINE HCL IN NACL 400 MCG/100ML IV SOLN
0.1000 ug/kg/h | INTRAVENOUS | Status: DC
  Filled 2018-02-28: qty 100

## 2018-02-28 MED ORDER — EPINEPHRINE PF 1 MG/ML IJ SOLN
0.0000 ug/min | INTRAVENOUS | Status: DC
  Filled 2018-02-28: qty 4

## 2018-02-28 MED ORDER — POTASSIUM CHLORIDE 2 MEQ/ML IV SOLN
80.0000 meq | INTRAVENOUS | Status: DC
Start: 2018-03-01 — End: 2018-03-01
  Filled 2018-02-28: qty 40

## 2018-02-28 MED ORDER — PLASMA-LYTE 148 IV SOLN
INTRAVENOUS | Status: DC
  Filled 2018-02-28: qty 2.5

## 2018-02-28 MED ORDER — KENNESTONE BLOOD CARDIOPLEGIA (KBC) MANNITOL SYRINGE (20%, 32ML)
32.0000 mL | Freq: Once | INTRAVENOUS | Status: DC
  Filled 2018-02-28: qty 32

## 2018-02-28 MED ORDER — VANCOMYCIN HCL 1000 MG IV SOLR
INTRAVENOUS | Status: AC
  Administered 2018-03-01: 1000 mL
  Filled 2018-02-28: qty 1000

## 2018-02-28 MED ORDER — SODIUM CHLORIDE 0.9 % IV SOLN
INTRAVENOUS | Status: DC
  Filled 2018-02-28: qty 30

## 2018-02-28 MED ORDER — TRANEXAMIC ACID (OHS) PUMP PRIME SOLUTION
2.0000 mg/kg | INTRAVENOUS | Status: DC
  Filled 2018-02-28: qty 1.42

## 2018-02-28 MED ORDER — SODIUM CHLORIDE 0.9 % IV SOLN
1.5000 g | INTRAVENOUS | Status: AC
  Administered 2018-03-01: 1.5 g via INTRAVENOUS
  Filled 2018-02-28: qty 1.5

## 2018-02-28 MED ORDER — TRANEXAMIC ACID (OHS) BOLUS VIA INFUSION
15.0000 mg/kg | INTRAVENOUS | Status: AC
  Administered 2018-03-01: 1068 mg via INTRAVENOUS
  Filled 2018-02-28: qty 1068

## 2018-02-28 MED ORDER — MAGNESIUM SULFATE 50 % IJ SOLN
40.0000 meq | INTRAMUSCULAR | Status: DC
  Filled 2018-02-28: qty 9.85

## 2018-02-28 NOTE — Anesthesia Preprocedure Evaluation (Addendum)
Anesthesia Evaluation  Patient identified by MRN, date of birth, ID band Patient awake    Reviewed: Allergy & Precautions, NPO status , Patient's Chart, lab work & pertinent test results, reviewed documented beta blocker date and time   History of Anesthesia Complications Negative for: history of anesthetic complications  Airway Mallampati: II  TM Distance: >3 FB Neck ROM: Full    Dental  (+) Teeth Intact, Dental Advisory Given, Caps, Chipped,    Pulmonary sleep apnea and Continuous Positive Airway Pressure Ventilation ,    Pulmonary exam normal breath sounds clear to auscultation       Cardiovascular hypertension, Pt. on medications and Pt. on home beta blockers (-) anginaNormal cardiovascular exam+ Valvular Problems/Murmurs MR  Rhythm:Regular Rate:Normal  2/19 ECHO: EF 55-60%, severe MR (P2), LA dilation   Neuro/Psych negative neurological ROS  negative psych ROS   GI/Hepatic Neg liver ROS, GERD  Medicated and Controlled,  Endo/Other  Hypothyroidism   Renal/GU negative Renal ROS     Musculoskeletal  (+) Arthritis ,   Abdominal   Peds  Hematology negative hematology ROS (+)   Anesthesia Other Findings   Reproductive/Obstetrics                           Anesthesia Physical Anesthesia Plan  ASA: III  Anesthesia Plan: General   Post-op Pain Management:    Induction: Intravenous  PONV Risk Score and Plan: 3 and Treatment may vary due to age or medical condition  Airway Management Planned: Oral ETT and Double Lumen EBT  Additional Equipment: Arterial line, PA Cath, 3D TEE, Ultrasound Guidance Line Placement, TEE and CVP  Intra-op Plan:   Post-operative Plan: Post-operative intubation/ventilation  Informed Consent: I have reviewed the patients History and Physical, chart, labs and discussed the procedure including the risks, benefits and alternatives for the proposed anesthesia with  the patient or authorized representative who has indicated his/her understanding and acceptance.   Dental advisory given  Plan Discussed with: CRNA and Surgeon  Anesthesia Plan Comments: (Plan routine monitors, A line, PA cath, GETA with TEE and post op ventilation)      Anesthesia Quick Evaluation

## 2018-02-28 NOTE — H&P (Signed)
[address]       [place] [address]             [phone]          CARDIOTHORACIC SURGERY HISTORY AND PHYSICAL EXAM  Referring Provider is Crenshaw, Denice Bors, MD PCP is Belva Bertin, Bennetta [place], [place]      Chief Complaint  Patient presents with  . Mitral Regurgitation    TEE 12/09/17    HPI:  Patient is a 67 year old female with history of mitral valve prolapse, rheumatic fever during childhood, frequent PVCs and palpitations, obstructive sleep apnea, and hypothyroidism who has been referred for surgical consultation to discuss treatment options for management of mitral valve prolapse with severe primary mitral regurgitation.  The patient states that she had rheumatic fever during childhood.  She was diagnosed with mitral valve prolapse more than 30 years ago.  She has otherwise remained healthy and fairly active physically until several months ago when she began to experience palpitations and fatigue.  She underwent a sleep study and was diagnosed with obstructive sleep apnea.  She was referred for cardiology consultation and evaluated by Dr. Stanford Breed in early December.  Transthoracic echocardiogram performed October 21, 2017 revealed normal left ventricular size and systolic function with mitral valve prolapse and moderate to severe mitral regurgitation.  There was moderate to severe left atrial enlargement and findings consistent with mild pulmonary hypertension.  The patient subsequently underwent transesophageal echocardiogram on December 09, 2017.  This confirmed the presence of mitral valve prolapse with severe prolapse involving the middle scallop of the posterior leaflet.  There was severe mitral regurgitation.  There was normal left ventricular systolic function.  There was moderate to severe left atrial enlargement.  No other significant abnormalities were noted.  Patient was referred for surgical consultation.  The patient is married and lives locally  in [place] with her husband.  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  Over the last several months the patient has developed progressive exertional shortness of breath and fatigue.  She states that she now gets intermittently short of breath with low-level activity and occasionally at rest.  She has had 2 or 3 episodes where she woke up in the middle night short of breath.  Shortness of breath is relieved by sitting upright.  She has had some intermittent tightness across her chest that can occur both with activity and at rest.  She has frequent palpitations, although this has improved since she has been treated with a beta-blocker.  She has decreased energy.  She has gained 15 pounds of weight and noticed some mild swelling around her left ankle.  She has intermittently complained of a persistent dry hacking cough.  She has had occasional dizzy spells without syncope.  Patient is a 67 year old female with history of mitral valve prolapse with severe mitral regurgitation, rheumatic fever during childhood, frequent PVCs and palpitations, obstructive sleep apnea,and hypothyroidism who returns to the office today for follow-up with tentative plan to proceed with minimally invasive mitral valve repair later this week.  She was originally seen in consultation on January 18, 2018.  Since then she underwent diagnostic cardiac catheterization and CT angiography.  She returns the office today to review the results of these tests and make final plans for surgery.  She reports no new problems or complaints.    Past Medical History:  Diagnosis Date  . Basal cell carcinoma of right lower leg   .  Dyspnea    all the time  . Fibrocystic disease of breast   . Hypothyroid   . IBS (irritable bowel syndrome)   . Mitral regurgitation   . MVP (mitral valve prolapse)    Murmur  . Pernicious anemia   . Rheumatic fever    as child  . Simple endometrial hyperplasia without  atypia   . Sleep apnea     Past Surgical History:  Procedure Laterality Date  . APPENDECTOMY    . LAPAROSCOPIC CHOLECYSTECTOMY  03/2002  . RIGHT/LEFT HEART CATH AND CORONARY ANGIOGRAPHY N/A 02/20/2018   Procedure: RIGHT/LEFT HEART CATH AND CORONARY ANGIOGRAPHY;  Surgeon: Sherren Mocha, MD;  Location: [facility];  Service: Cardiovascular;  Laterality: N/A;  . TEE WITHOUT CARDIOVERSION N/A 12/09/2017   Procedure: TRANSESOPHAGEAL ECHOCARDIOGRAM (TEE);  Surgeon: Lelon Perla, MD;  Location: [facility] END[facility];  Service: Cardiovascular;  Laterality: N/A;  . TONSILLECTOMY AND ADENOIDECTOMY      Family History  Problem Relation Age of Onset  . Diabetes Father   . Heart attack Father   . Osteoporosis Mother   . Diabetes Brother   . Heart failure Brother   . Diabetes Paternal Grandmother     Social History Social History   Tobacco Use  . Smoking status: Never Smoker  . Smokeless tobacco: Never Used  Substance Use Topics  . Alcohol use: No    Frequency: Never  . Drug use: No    Prior to Admission medications   Medication Sig Start Date End Date Taking? Authorizing Provider  azelastine (ASTELIN) 0.1 % nasal spray Place 1 spray into both nostrils daily as needed for allergies.  10/24/17   [provider]  cetirizine (ZYRTEC) 10 MG tablet Take 10 mg by mouth daily.    [provider]  Cholecalciferol (VITAMIN D) 2000 UNITS CAPS Take 2,000 Units by mouth daily.     [provider]  cyanocobalamin (,VITAMIN B-12,) 1000 MCG/ML injection Inject 1,000 mcg into the muscle 3 (three) times a week.    [provider]  esomeprazole (NEXIUM) 40 MG capsule Take 40 mg by mouth as needed.  11/29/17   [provider]  estazolam (PROSOM) 2 MG tablet Take 1-2 mg by mouth at bedtime as needed (for sleep).     [provider]  estradiol (CLIMARA - DOSED IN MG/24 HR) 0.05 mg/24hr patch Place 1 patch (0.05 mg total) onto the skin once a  week. Patient taking differently: Place 0.05 mg onto the skin every Sunday.  10/21/14   Nunzio Cobbs, MD  furosemide (LASIX) 20 MG tablet Take 1 tablet (20 mg total) by mouth daily. 02/20/18 02/20/19  Sherren Mocha, MD  ketotifen (ZADITOR) 0.025 % ophthalmic solution Place 1 drop into both eyes 2 (two) times daily as needed (for dry eyes).    [provider]  levothyroxine (SYNTHROID, LEVOTHROID) 88 MCG tablet Take 88 mcg by mouth daily before breakfast.  01/09/18   [provider]  liothyronine (CYTOMEL) 5 MCG tablet Take 15 mcg by mouth daily.  01/09/18   [provider]  meloxicam (MOBIC) 15 MG tablet Take 15 mg by mouth daily as needed for pain.     [provider]  Multiple Vitamins-Minerals (MULTIVITAL PO) Take 1 tablet by mouth daily.     [provider]  nebivolol (BYSTOLIC) 2.5 MG tablet Take 1 tablet (2.5 mg total) by mouth daily. 01/13/18   Lelon Perla, MD  progesterone (PROMETRIUM) 200 MG capsule  Take 1 capsule (200 mg total) by mouth daily. 07/28/14   Elveria Rising, MD  sodium chloride (OCEAN) 0.65 % SOLN nasal spray Place 1-2 sprays into both nostrils as needed for congestion.    [provider]  traMADol-acetaminophen (ULTRACET) 37.5-325 MG tablet Take 0.5-1 tablets by mouth every 6 (six) hours as needed for pain. 11/28/17   [provider]    Allergies  Allergen Reactions  . Gluten Meal Other (See Comments)    Stomach pain  . Protonix [Pantoprazole] Itching    Review of Systems:              General:                      normal appetite, decreased energy, + weight gain, no weight loss, no fever             Cardiac:                       + chest pain with exertion, + chest pain at rest, + SOB with exertion, occasional resting SOB, + PND, + orthopnea, + palpitations, no arrhythmia, no atrial fibrillation, + mild LE edema, + dizzy spells, no syncope             Respiratory:                 + shortness  of breath, no home oxygen, no productive cough, + dry cough, + bronchitis, no wheezing, no hemoptysis, no asthma, no pain with inspiration or cough, + sleep apnea, + CPAP at night             GI:                               no difficulty swallowing, no reflux, + frequent heartburn, no hiatal hernia, no abdominal pain, no constipation, no diarrhea, no hematochezia, no hematemesis, no melena             GU:                              no dysuria,  no frequency, no urinary tract infection, no hematuria, no kidney stones, no kidney disease             Vascular:                     no pain suggestive of claudication, no pain in feet, no leg cramps, + varicose veins, no DVT, no non-healing foot ulcer             Neuro:                         no stroke, no TIA's, no seizures, no headaches, no temporary blindness one eye,  no slurred speech, no peripheral neuropathy, no chronic pain, no instability of gait, no memory/cognitive dysfunction             Musculoskeletal:         no arthritis except mild lower back, no joint swelling, no myalgias, no difficulty walking, normal mobility              Skin:  no rash, no itching, no skin infections, no pressure sores or ulcerations             Psych:                         no anxiety, no depression, no nervousness, no unusual recent stress             Eyes:                           no blurry vision, no floaters, no recent vision changes, + wears glasses or contacts             ENT:                            no hearing loss, no loose or painful teeth, no dentures, last saw dentist within the past year             Hematologic:               no easy bruising, no abnormal bleeding, no clotting disorder, no frequent epistaxis             Endocrine:                   no diabetes, does not check CBG's at home                           Physical Exam:              BP 105/62 (BP Location: Right Arm, Patient Position: Sitting, Cuff Size:  Large)   Pulse (!) 52   Resp 16   Ht 5\' 9"  (1.753 m)   Wt 160 lb (72.6 kg)   LMP 09/17/2012   SpO2 98% Comment: ON RA  BMI 23.63 kg/m              General:                        well-appearing             HEENT:                       Unremarkable              Neck:                           no JVD, no bruits, no adenopathy              Chest:                          clear to auscultation, symmetrical breath sounds, no wheezes, no rhonchi              CV:                              RRR, grade III/VI holosystolic murmur              Abdomen:                    soft, non-tender, no masses  Extremities:                 warm, well-perfused, pulses diminished, trace LE edema             Rectal/GU                   Deferred             Neuro:                         Grossly non-focal and symmetrical throughout             Skin:                            Clean and dry, no rashes, no breakdown   Diagnostic Tests:  Transthoracic Echocardiography  Patient: Roxan, Yamamoto MR #: [id number] Study Date: 10/21/2017 Gender: F Age: 24 Height: 175.3 cm Weight: 70 kg BSA: 1.85 m^2 Pt. Status: Room:  Yeager Matewan Crenshaw [place], [place] SONOGRAPHER Jimmy Reel, RDCS  cc:  ------------------------------------------------------------------- LV EF: 55% - 60%  ------------------------------------------------------------------- Study Conclusions  - Left ventricle: The cavity size was normal. Wall thickness was normal. Systolic function was normal. The estimated ejection fraction was in the range of 55% to 60%. Wall motion was normal; there were no regional wall motion abnormalities. - Aortic valve: Valve area (VTI): 1.89 cm^2. Valve area (Vmax): 1.83 cm^2. Valve area (Vmean): 2.03 cm^2. - Mitral valve: Moderate,  holosystolicprolapse. There was moderate to severe regurgitation. - Left atrium: The atrium was moderately to severely dilated. - Pulmonary arteries: PA peak pressure: 36 mm Hg (S).  Impressions:  - Moderate thinkening of the mitral valve. Prolapse of the posterior leaflet causing moderate to severe eccentric mitral regurgitation. If clinically indicated, suggest TEE evaluation for intervention. LA is moderate to severely enlarged. LVEF is visually estimated to be 55-60%.  ------------------------------------------------------------------- Study data: No prior study was available for comparison. Study status: Routine. Procedure: The patient reported no pain pre or post test. Transthoracic echocardiography. Image quality was adequate. Study completion: There were no complications. Transthoracic echocardiography. M-mode, complete 2D, spectral Doppler, and color Doppler. Birthdate: Patient birthdate: 1951/06/07. Age: Patient is 67 yr old. Sex: Gender: female. BMI: 22.8 kg/m^2. Blood pressure: 118/78 Patient status: Outpatient. Study date: Study date: 10/21/2017. Study time: 03:23 PM.  -------------------------------------------------------------------  ------------------------------------------------------------------- Left ventricle: The cavity size was normal. Wall thickness was normal. Systolic function was normal. The estimated ejection fraction was in the range of 55% to 60%. Wall motion was normal; there were no regional wall motion abnormalities.  ------------------------------------------------------------------- Aortic valve: Trileaflet; normal thickness leaflets. Mobility was not restricted. Doppler: Transvalvular velocity was within the normal range. There was no stenosis. There was no regurgitation. VTI ratio of LVOT to aortic valve: 0.6. Valve area (VTI): 1.89 cm^2. Indexed valve area (VTI): 1.02 cm^2/m^2. Peak velocity  ratio of LVOT to aortic valve: 0.58. Valve area (Vmax): 1.83 cm^2. Indexed valve area (Vmax): 0.99 cm^2/m^2. Mean velocity ratio of LVOT to aortic valve: 0.65. Valve area (Vmean): 2.03 cm^2. Indexed valve area (Vmean): 1.1 cm^2/m^2. Mean gradient (S): 4 mm Hg. Peak gradient (S): 9 mm Hg.  ------------------------------------------------------------------- Aorta: Aortic root: The aortic root was normal in size.  ------------------------------------------------------------------- Mitral valve: Mildly thickened leaflets . Mobility was not restricted. Moderate, holosystolicprolapse. Doppler: Transvalvular velocity was within the normal range. There was no  evidence for stenosis. There was moderate to severe regurgitation. Peak gradient (D): 6 mm Hg.  ------------------------------------------------------------------- Left atrium: The atrium was moderately to severely dilated.  ------------------------------------------------------------------- Right ventricle: The cavity size was normal. Wall thickness was normal. Systolic function was normal.  ------------------------------------------------------------------- Pulmonic valve: Not well visualized. The valve appears to be grossly normal. Doppler: Transvalvular velocity was within the normal range. There was no evidence for stenosis.  ------------------------------------------------------------------- Tricuspid valve: Structurally normal valve. Doppler: Transvalvular velocity was within the normal range. There was no regurgitation.  ------------------------------------------------------------------- Pulmonary artery: The main pulmonary artery was normal-sized. Systolic pressure was within the normal range.  ------------------------------------------------------------------- Right atrium: The atrium was normal in  size.  ------------------------------------------------------------------- Pericardium: There was no pericardial effusion.  ------------------------------------------------------------------- Systemic veins: Inferior vena cava: The vessel was normal in size.  ------------------------------------------------------------------- Measurements  Left ventricle Value Reference LV ID, ED, PLAX chordal 49.5 mm 43 - 52 LV ID, ES, PLAX chordal 29 mm 23 - 38 LV fx shortening, PLAX chordal 41 % >=29 LV PW thickness, ED 9.9 mm ---------- IVS/LV PW ratio, ED 0.78 <=1.3 Stroke volume, 2D 57 ml ---------- Stroke volume/bsa, 2D 31 ml/m^2 ---------- LV e&', lateral 7.72 cm/s ---------- LV E/e&', lateral 15.67 ---------- LV e&', medial 8.59 cm/s ---------- LV E/e&', medial 14.09 ---------- LV e&', average 8.16 cm/s ---------- LV E/e&', average 14.84 ---------- Longitudinal strain, TDI 19 % ----------  Ventricular septum Value Reference IVS thickness, ED 7.69 mm ----------  LVOT Value Reference LVOT ID, S 20 mm ---------- LVOT area 3.14 cm^2 ---------- LVOT peak velocity, S 85.5 cm/s ---------- LVOT mean velocity, S 63.3 cm/s ---------- LVOT VTI, S 18.2 cm ----------  Aortic  valve Value Reference Aortic valve peak velocity, S 147 cm/s ---------- Aortic valve mean velocity, S 98 cm/s ---------- Aortic valve VTI, S 30.2 cm ---------- Aortic mean gradient, S 4 mm Hg ---------- Aortic peak gradient, S 9 mm Hg ---------- VTI ratio, LVOT/AV 0.6 ---------- Aortic valve area, VTI 1.89 cm^2 ---------- Aortic valve area/bsa, VTI 1.02 cm^2/m^2 ---------- Velocity ratio, peak, LVOT/AV 0.58 ---------- Aortic valve area, peak velocity 1.83 cm^2 ---------- Aortic valve area/bsa, peak 0.99 cm^2/m^2 ---------- velocity Velocity ratio, mean, LVOT/AV 0.65 ---------- Aortic valve area, mean velocity 2.03 cm^2 ---------- Aortic valve area/bsa, mean 1.1 cm^2/m^2 ---------- velocity  Aorta Value Reference Aortic root ID, ED 35 mm ---------- Ascending aorta ID, A-P, S 30 mm ----------  Left atrium Value Reference LA ID, A-P, ES 37 mm ---------- LA ID/bsa, A-P 2 cm/m^2 <=2.2 LA volume, S 80.4 ml ---------- LA volume/bsa, S 43.5 ml/m^2 ---------- LA volume, ES, 1-p A4C 60 ml ---------- LA volume/bsa, ES, 1-p A4C 32.5 ml/m^2 ---------- LA volume, ES, 1-p A2C 103 ml ---------- LA volume/bsa, ES, 1-p A2C 55.7 ml/m^2 ----------  Mitral valve Value  Reference Mitral E-wave peak velocity 121 cm/s ---------- Mitral A-wave peak velocity 61.6 cm/s ---------- Mitral deceleration time (H) 290 ms 150 - 230 Mitral peak gradient, D 6 mm Hg ---------- Mitral E/A ratio, peak 2 ---------- Mitral regurg VTI, PISA 203 cm ---------- Mitral ERO, PISA 0.4 cm^2 ---------- Mitral regurg volume, PISA 81 ml ----------  Pulmonary arteries Value Reference PA pressure, S, DP (H) 36 mm Hg <=30  Tricuspid valve Value Reference Tricuspid regurg peak velocity 263 cm/s ---------- Tricuspid peak RV-RA gradient 28 mm Hg ----------  Right atrium Value Reference RA ID, S-I, ES, A4C (H) 54.8 mm 34 - 49 RA area, ES, A4C 17.1 cm^2 8.3 - 19.5 RA volume, ES, A/L  43.4 ml ---------- RA volume/bsa, ES, A/L 23.5 ml/m^2 ----------  Systemic veins Value Reference Estimated CVP 8 mm Hg ----------  Right ventricle Value Reference RV pressure, S, DP (H) 36 mm Hg <=30  Legend: (L) and (H) mark values outside specified reference range.  ------------------------------------------------------------------- Prepared and Electronically Authenticated by  Jyl Heinz, MD 2019-01-04T17:22:30    Transesophageal Echocardiography  Patient: Corah, Willeford MR #: [id number] Study Date: 12/09/2017 Gender: F Age: 74 Height: 175.3  cm Weight: 70.9 kg BSA: 1.86 m^2 Pt. Status: Room:  Lilly Crenshaw ORDERING Kirk Ruths PERFORMING Kirk Ruths REFERRING Kirk Ruths SONOGRAPHER Dance, Tiffany  cc:  ------------------------------------------------------------------- LV EF: 55% - 60%  ------------------------------------------------------------------- Indications: Mitral valve prolapse 424.0. Mitral regurgitation 424.0.  ------------------------------------------------------------------- History: PMH: Hypothyroidism.  ------------------------------------------------------------------- Study Conclusions  - Left ventricle: Systolic function was normal. The estimated ejection fraction was in the range of 55% to 60%. Wall motion was normal; there were no regional wall motion abnormalities. - Aortic valve: No evidence of vegetation. - Mitral valve: Thickening. Severe prolapse, involving the middle scallop of the posterior leaflet. There was severe regurgitation directed eccentrically. - Left atrium: The atrium was moderately to severely dilated. No evidence of thrombus in the atrial cavity or appendage. - Right atrium: No evidence of thrombus in the atrial cavity or appendage. - Atrial septum: No defect or patent foramen ovale was identified. - Tricuspid valve: No evidence of vegetation. - Pulmonic valve: No evidence of vegetation.  Impressions:  - Normal LV function; severe prolapse of posterior MV leaflet (P2); severe eccentric MR; moderate to severe LAE; mild TR.  ------------------------------------------------------------------- Study data: Study status: Routine. Consent: The risks, benefits, and alternatives to the procedure were explained to the patient and informed consent was obtained. Procedure: The patient reported no pain pre or post test. Initial setup. The patient  was brought to the laboratory. Surface ECG leads were monitored. Sedation. Moderate sedation with intermittent deep sedation was administered by cardiology staff. Transesophageal echocardiography. An adult multiplane transesophageal probe was inserted by the attending cardiologistwithout difficulty. Image quality was adequate. Study completion: The patient tolerated the procedure well. There were no complications. Administered medications: Fentanyl, 47mcg, IV. Midazolam, 5mg , IV. Diagnostic transesophageal echocardiography. 2D and color Doppler. Birthdate: Patient birthdate: [birth year]. Age: Patient is 67 yr old. Sex: Gender: female. BMI: 23.1 kg/m^2. Blood pressure: 98/56 Patient status: Outpatient. Study date: Study date: 12/09/2017. Study time: 10:04 AM. Location: Endoscopy.  -------------------------------------------------------------------  ------------------------------------------------------------------- Left ventricle: Systolic function was normal. The estimated ejection fraction was in the range of 55% to 60%. Wall motion was normal; there were no regional wall motion abnormalities.  ------------------------------------------------------------------- Aortic valve: Structurally normal valve. Cusp separation was normal. No evidence of vegetation. Doppler: There was no regurgitation.  ------------------------------------------------------------------- Aorta: Descending aorta: The descending aorta had mild diffuse disease.  ------------------------------------------------------------------- Mitral valve: Thickening. Severe prolapse, involving the middle scallop of the posterior leaflet. Doppler: There was severe regurgitation directed eccentrically.  ------------------------------------------------------------------- Left atrium: The atrium was moderately to severely dilated. No evidence of thrombus in the atrial cavity  or appendage.  ------------------------------------------------------------------- Atrial septum: No defect or patent foramen ovale was identified.  ------------------------------------------------------------------- Right ventricle: The cavity size was normal. Systolic function was normal.  ------------------------------------------------------------------- Pulmonic valve: Structurally normal valve. Cusp separation was normal. No evidence of vegetation. Doppler: There was mild regurgitation.  ------------------------------------------------------------------- Tricuspid valve: Structurally normal valve. Leaflet separation was normal. No evidence of vegetation. Doppler: There was mild regurgitation.  ------------------------------------------------------------------- Right atrium: The atrium was normal  in size. No evidence of thrombus in the atrial cavity or appendage.  ------------------------------------------------------------------- Pericardium: There was no pericardial effusion.  ------------------------------------------------------------------- Prepared and Electronically Authenticated by  Kirk Ruths 2019-02-22T11:29:23   RIGHT/LEFT HEART CATH AND CORONARY ANGIOGRAPHY  Conclusion     Hemodynamic findings consistent with moderate pulmonary hypertension.  1. Hemodynamic findings consistent with severe mitral regurgitation, including large V-waves and pulmonary HTN 2. Widely patent coronary arteries without obstructive disease  Plan: Continued evaluation for mitral valve repair. Start low-dose diuretic Rx.    Indications   Severe mitral insufficiency [I34.0 (ICD-10-CM)]  Procedural Details/Technique   Technical Details INDICATION: Severe mitral regurgitation, pre-op study  PROCEDURAL DETAILS: There was an indwelling IV in a right antecubital vein. Using normal sterile technique, the IV was changed out for a 5 Fr brachial  sheath over a 0.018 inch wire. The right wrist was then prepped, draped, and anesthetized with 1% lidocaine. Using the modified Seldinger technique a 5/6 French Slender sheath was placed in the right radial artery. Intra-arterial verapamil was administered through the radial artery sheath. IV heparin was administered after a JR4 catheter was advanced into the central aorta. A Swan-Ganz catheter was used for the right heart catheterization. Standard protocol was followed for recording of right heart pressures and sampling of oxygen saturations. Fick cardiac output was calculated. Standard Judkins catheters were used for selective coronary angiography and the JR4 is used to record LV pressure and Ao pullback. There were no immediate procedural complications. The patient was transferred to the post catheterization recovery area for further monitoring.     Estimated blood loss <50 mL.  During this procedure the patient was administered the following to achieve and maintain moderate conscious sedation: Versed 2 mg, Fentanyl 25 mcg, while the patient's heart rate, blood pressure, and oxygen saturation were continuously monitored. The period of conscious sedation was 31 minutes, of which I was present face-to-face 100% of this time.  Coronary Findings   Diagnostic  Dominance: Right  Left Anterior Descending  Vessel is large. Vessel is angiographically normal.  Ramus Intermedius  Vessel is moderate in size. Vessel is angiographically normal.  Left Circumflex  First Obtuse Marginal Branch  Vessel is small in size.  Second Obtuse Marginal Branch  Vessel is small in size.  Right Coronary Artery  Vessel is large. Vessel is angiographically normal.  Intervention   No interventions have been documented.  Right Heart   Right Heart Pressures Hemodynamic findings consistent with moderate pulmonary hypertension. Elevated LV EDP consistent with volume overload.  Coronary Diagrams   Diagnostic Diagram        Implants    No implant documentation for this case.  MERGE Images   Show images for CARDIAC CATHETERIZATION   Link to Procedure Log   Procedure Log    Hemo Data    Most Recent Value  Fick Cardiac Output 6.29 L/min  Fick Cardiac Output Index 3.46 (L/min)/BSA  RA A Wave 21 mmHg  RA V Wave 18 mmHg  RA Mean 17 mmHg  RV Systolic Pressure 52 mmHg  RV Diastolic Pressure 10 mmHg  RV EDP 21 mmHg  PA Systolic Pressure 50 mmHg  PA Diastolic Pressure 23 mmHg  PA Mean 35 mmHg  PW A Wave 27 mmHg  PW V Wave 39 mmHg  PW Mean 24 mmHg  AO Systolic Pressure 366 mmHg  AO Diastolic Pressure 69 mmHg  AO Mean 96 mmHg  LV Systolic Pressure 294 mmHg  LV Diastolic Pressure 12 mmHg  LV EDP  28 mmHg  Arterial Occlusion Pressure Extended Systolic Pressure 315 mmHg  Arterial Occlusion Pressure Extended Diastolic Pressure 67 mmHg  Arterial Occlusion Pressure Extended Mean Pressure 93 mmHg  Left Ventricular Apex Extended Systolic Pressure 176 mmHg  Left Ventricular Apex Extended Diastolic Pressure 10 mmHg  Left Ventricular Apex Extended EDP Pressure 24 mmHg  QP/QS 1  TPVR Index 10.13 HRUI  TSVR Index 27.76 HRUI  PVR SVR Ratio 0.14  TPVR/TSVR Ratio 0.36     CT ANGIOGRAPHY CHEST, ABDOMEN AND PELVIS  TECHNIQUE: Multidetector CT imaging through the chest, abdomen and pelvis was performed using the standard protocol during bolus administration of intravenous contrast. Multiplanar reconstructed images and MIPs were obtained and reviewed to evaluate the vascular anatomy.  CONTRAST: 83mL ISOVUE-370 IOPAMIDOL (ISOVUE-370) INJECTION 76%  COMPARISON: None.  FINDINGS: CTA CHEST FINDINGS  Cardiovascular: Four-chamber cardiac enlargement. Incomplete opacification of the pulmonary arterial tree; the exam was not optimized for detection of pulmonary emboli. Adequate contrast opacification of the thoracic aorta with no evidence of dissection, aneurysm, or stenosis. There is  classic 3-vessel brachiocephalic arch anatomy without proximal stenosis. No significant atheromatous irregularity.  Mediastinum/Nodes: No enlarged mediastinal, hilar, or axillary lymph nodes. Calcified right hilar lymph nodes. Thyroid gland, trachea, and esophagus demonstrate no significant findings. No pericardial effusion.  Lungs/Pleura: Lungs are clear. No pleural effusion or pneumothorax.  Musculoskeletal: No chest wall abnormality. No acute or significant osseous findings.  Review of the MIP images confirms the above findings.  CTA ABDOMEN AND PELVIS FINDINGS  VASCULAR  Aorta: Normal caliber aorta without aneurysm, dissection, vasculitis or significant stenosis. No significant atheromatous plaque.  Celiac: Patent without evidence of aneurysm, dissection, vasculitis or significant stenosis.  SMA: Patent without evidence of aneurysm, dissection, vasculitis or significant stenosis.  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 3 left renal arteries, superior dominant, all appearing widely patent.  IMA: Patent without evidence of aneurysm, dissection, vasculitis or significant stenosis.  Inflow: Patent without evidence of aneurysm, dissection, vasculitis or significant stenosis. No significant atheromatous irregularity. Minimal tortuosity.  Veins: No obvious venous abnormality within the limitations of this arterial phase study.  Review of the MIP images confirms the above findings.  NON-VASCULAR  Hepatobiliary: No focal liver abnormality is seen. Status post cholecystectomy. No biliary dilatation.  Pancreas: Unremarkable. No pancreatic ductal dilatation or surrounding inflammatory changes.  Spleen: Normal in size. Small calcified granulomas. No other focal abnormality.  Adrenals/Urinary Tract: Normal adrenal glands. 3.5 cm cyst from the lower pole left kidney. No hydronephrosis. Urinary bladder physiologically  distended.  Stomach/Bowel: Stomach and small bowel are decompressed. Appendix not discretely identified. Colon is nondilated, unremarkable.  Lymphatic: No abdominal or pelvic adenopathy.  Reproductive: Uterus and bilateral adnexa are unremarkable.  Other: No ascites. No free air.  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 Negative for fracture or worrisome bone lesion.  Review of the MIP images confirms the above findings.  IMPRESSION: 1. No acute findings. 2. Negative for thoracic or abdominal aortic aneurysm or dissection. 3. No significant aortoiliac occlusive disease or tortuosity.   Electronically Signed By: Lucrezia Europe M.D. On: 02/13/2018 15:41   Impression:  Patient has mitral valve prolapse with stage D severe symptomatic primary mitral regurgitation. She presents with recent progression of symptoms of exertional shortness of breath, atypical chest pain, frequent palpitations, and occasional dizzy spells. I have personally reviewed the patient's recent transthoracic and transesophageal echocardiograms, diagnostic cardiac catheterization, and CT angiogram. 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 Left ventricular systolic function remains normal. I agree the patient needs elective mitral valve repair.  Catheterization is notable for the absence of significant coronary artery disease.  CT angiography reveals no contraindication to peripheral cannulation for surgery.   Plan:  The patientand her husband were againcounseled at length regarding the indications, risks and potential benefits of mitral valve repair. The rationale for elective surgery has been explained, including a comparison between surgery and continued  medical therapy with close follow-up. The likelihood of successful and durable valve repair has been discussed with particular reference to the findings of their recent echocardiogram. Based upon these findings and previous experience, I have quoted them a greater than 95percent likelihood of successful valve repair. Alternative surgical approaches have been discussed including a comparison between conventional sternotomy and minimally-invasive techniques. The relative risks and benefits of each have been reviewed as they pertain to the patient's specific circumstances, and all of their questions have been addressed. Expectations for the patient's postoperative convalescence have been discussed.The patient understands and accepts all potential risks of surgery including but not limited to risk of death, stroke or other neurologic complication, myocardial infarction, congestive heart failure, respiratory failure, renal failure, bleeding requiring transfusion and/or reexploration, arrhythmia, infection or other wound complications, pneumonia, pleural and/or pericardial effusion, pulmonary embolus, aortic dissection or other major vascular complication, or delayed complications related to valve repair or replacement including but not limited to structural valve deterioration and failure, thrombosis, embolization, endocarditis, or paravalvular leak. Specific risks potentially related to the minimally-invasive approach were discussed at length, including but not limited to risk of conversion to full or partial sternotomy, aortic dissection or other major vascular complication, unilateral acute lung injury or pulmonary edema, phrenic nerve dysfunction or paralysis, rib fracture, chronic pain, lung hernia, or lymphocele. All of their questions have been answered.      [name], MD 02/27/2018 1:45 PM

## 2018-03-01 ENCOUNTER — Inpatient Hospital Stay (HOSPITAL_COMMUNITY)
Admission: RE | Admit: 2018-03-01 | Discharge: 2018-03-07 | DRG: 219 | Disposition: A | Discharge status: Home / Self Care | Payer: Medicare Other | Source: Ambulatory Visit | Attending: Thoracic Surgery (Cardiothoracic Vascular Surgery) | Admitting: Thoracic Surgery (Cardiothoracic Vascular Surgery)

## 2018-03-01 ENCOUNTER — Encounter (HOSPITAL_COMMUNITY)
Admission: RE | Disposition: A | Discharge status: Home / Self Care | Payer: Self-pay | Source: Ambulatory Visit | Attending: Thoracic Surgery (Cardiothoracic Vascular Surgery)

## 2018-03-01 ENCOUNTER — Ambulatory Visit (HOSPITAL_COMMUNITY): Payer: Medicare Other

## 2018-03-01 ENCOUNTER — Inpatient Hospital Stay (HOSPITAL_COMMUNITY): Payer: Medicare Other | Admitting: Anesthesiology

## 2018-03-01 ENCOUNTER — Encounter (HOSPITAL_COMMUNITY): Payer: Self-pay | Admitting: *Deleted

## 2018-03-01 ENCOUNTER — Inpatient Hospital Stay (HOSPITAL_COMMUNITY): Payer: Medicare Other

## 2018-03-01 DIAGNOSIS — I340 Nonrheumatic mitral (valve) insufficiency: Secondary | ICD-10-CM | POA: Diagnosis present

## 2018-03-01 DIAGNOSIS — D62 Acute posthemorrhagic anemia: Secondary | ICD-10-CM | POA: Diagnosis not present

## 2018-03-01 DIAGNOSIS — I481 Persistent atrial fibrillation: Secondary | ICD-10-CM | POA: Diagnosis not present

## 2018-03-01 DIAGNOSIS — Z8249 Family history of ischemic heart disease and other diseases of the circulatory system: Secondary | ICD-10-CM

## 2018-03-01 DIAGNOSIS — E039 Hypothyroidism, unspecified: Secondary | ICD-10-CM | POA: Diagnosis present

## 2018-03-01 DIAGNOSIS — Z9049 Acquired absence of other specified parts of digestive tract: Secondary | ICD-10-CM

## 2018-03-01 DIAGNOSIS — I4891 Unspecified atrial fibrillation: Secondary | ICD-10-CM | POA: Diagnosis not present

## 2018-03-01 DIAGNOSIS — T462X5A Adverse effect of other antidysrhythmic drugs, initial encounter: Secondary | ICD-10-CM | POA: Diagnosis not present

## 2018-03-01 DIAGNOSIS — I5033 Acute on chronic diastolic (congestive) heart failure: Secondary | ICD-10-CM | POA: Diagnosis not present

## 2018-03-01 DIAGNOSIS — J9811 Atelectasis: Secondary | ICD-10-CM

## 2018-03-01 DIAGNOSIS — Z79899 Other long term (current) drug therapy: Secondary | ICD-10-CM

## 2018-03-01 DIAGNOSIS — D6959 Other secondary thrombocytopenia: Secondary | ICD-10-CM | POA: Diagnosis present

## 2018-03-01 DIAGNOSIS — I110 Hypertensive heart disease with heart failure: Secondary | ICD-10-CM | POA: Diagnosis present

## 2018-03-01 DIAGNOSIS — I272 Pulmonary hypertension, unspecified: Secondary | ICD-10-CM | POA: Diagnosis present

## 2018-03-01 DIAGNOSIS — Z9689 Presence of other specified functional implants: Secondary | ICD-10-CM

## 2018-03-01 DIAGNOSIS — G4733 Obstructive sleep apnea (adult) (pediatric): Secondary | ICD-10-CM | POA: Diagnosis present

## 2018-03-01 DIAGNOSIS — Z85828 Personal history of other malignant neoplasm of skin: Secondary | ICD-10-CM | POA: Diagnosis not present

## 2018-03-01 DIAGNOSIS — Z9889 Other specified postprocedural states: Secondary | ICD-10-CM

## 2018-03-01 DIAGNOSIS — K5900 Constipation, unspecified: Secondary | ICD-10-CM | POA: Diagnosis not present

## 2018-03-01 DIAGNOSIS — Z79890 Hormone replacement therapy (postmenopausal): Secondary | ICD-10-CM | POA: Diagnosis not present

## 2018-03-01 DIAGNOSIS — I341 Nonrheumatic mitral (valve) prolapse: Secondary | ICD-10-CM | POA: Diagnosis present

## 2018-03-01 DIAGNOSIS — R112 Nausea with vomiting, unspecified: Secondary | ICD-10-CM | POA: Diagnosis not present

## 2018-03-01 DIAGNOSIS — Z4682 Encounter for fitting and adjustment of non-vascular catheter: Secondary | ICD-10-CM

## 2018-03-01 DIAGNOSIS — E8779 Other fluid overload: Secondary | ICD-10-CM | POA: Diagnosis not present

## 2018-03-01 HISTORY — PX: TEE WITHOUT CARDIOVERSION: SHX5443

## 2018-03-01 HISTORY — DX: Other specified postprocedural states: Z98.890

## 2018-03-01 HISTORY — PX: MITRAL VALVE REPAIR: SHX2039

## 2018-03-01 LAB — CBC
HCT: 28.3 % — ABNORMAL LOW (ref 36.0–46.0)
HCT: 30.9 % — ABNORMAL LOW (ref 36.0–46.0)
HEMOGLOBIN: 10 g/dL — AB (ref 12.0–15.0)
Hemoglobin: 9.4 g/dL — ABNORMAL LOW (ref 12.0–15.0)
MCH: 32.9 pg (ref 26.0–34.0)
MCH: 32.4 pg (ref 26.0–34.0)
MCHC: 33.2 g/dL (ref 30.0–36.0)
MCHC: 32.4 g/dL (ref 30.0–36.0)
MCV: 99 fL (ref 78.0–100.0)
MCV: 100 fL (ref 78.0–100.0)
PLATELETS: 92 10*3/uL — AB (ref 150–400)
Platelets: 102 10*3/uL — ABNORMAL LOW (ref 150–400)
RBC: 2.86 MIL/uL — AB (ref 3.87–5.11)
RBC: 3.09 MIL/uL — AB (ref 3.87–5.11)
RDW: 12.2 % (ref 11.5–15.5)
RDW: 12.3 % (ref 11.5–15.5)
WBC: 10.6 10*3/uL — AB (ref 4.0–10.5)
WBC: 10.1 10*3/uL (ref 4.0–10.5)

## 2018-03-01 LAB — GLUCOSE, CAPILLARY
GLUCOSE-CAPILLARY: 131 mg/dL — AB (ref 65–99)
GLUCOSE-CAPILLARY: 133 mg/dL — AB (ref 65–99)
Glucose-Capillary: 112 mg/dL — ABNORMAL HIGH (ref 65–99)
Glucose-Capillary: 113 mg/dL — ABNORMAL HIGH (ref 65–99)
Glucose-Capillary: 133 mg/dL — ABNORMAL HIGH (ref 65–99)
Glucose-Capillary: 133 mg/dL — ABNORMAL HIGH (ref 65–99)
Glucose-Capillary: 136 mg/dL — ABNORMAL HIGH (ref 65–99)
Glucose-Capillary: 141 mg/dL — ABNORMAL HIGH (ref 65–99)
Glucose-Capillary: 157 mg/dL — ABNORMAL HIGH (ref 65–99)

## 2018-03-01 LAB — POCT I-STAT 3, ART BLOOD GAS (G3+)
ACID-BASE DEFICIT: 2 mmol/L (ref 0.0–2.0)
ACID-BASE DEFICIT: 2 mmol/L (ref 0.0–2.0)
BICARBONATE: 24.1 mmol/L (ref 20.0–28.0)
Bicarbonate: 25.1 mmol/L (ref 20.0–28.0)
Bicarbonate: 23.7 mmol/L (ref 20.0–28.0)
O2 SAT: 99 %
O2 SAT: 99 %
O2 SAT: 100 %
PCO2 ART: 36.8 mmHg (ref 32.0–48.0)
PO2 ART: 116 mmHg — AB (ref 83.0–108.0)
PO2 ART: 238 mmHg — AB (ref 83.0–108.0)
Patient temperature: 34.3
Patient temperature: 36.6
TCO2: 26 mmol/L (ref 22–32)
TCO2: 25 mmol/L (ref 22–32)
TCO2: 25 mmol/L (ref 22–32)
pCO2 arterial: 42.2 mmHg (ref 32.0–48.0)
pCO2 arterial: 44.1 mmHg (ref 32.0–48.0)
pH, Arterial: 7.43 (ref 7.350–7.450)
pH, Arterial: 7.355 (ref 7.350–7.450)
pH, Arterial: 7.344 — ABNORMAL LOW (ref 7.350–7.450)
pO2, Arterial: 158 mmHg — ABNORMAL HIGH (ref 83.0–108.0)

## 2018-03-01 LAB — POCT I-STAT, CHEM 8
BUN: 11 mg/dL (ref 6–20)
Calcium, Ion: 1.08 mmol/L — ABNORMAL LOW (ref 1.15–1.40)
Chloride: 108 mmol/L (ref 101–111)
Creatinine, Ser: 0.6 mg/dL (ref 0.44–1.00)
GLUCOSE: 124 mg/dL — AB (ref 65–99)
HCT: 29 % — ABNORMAL LOW (ref 36.0–46.0)
HEMOGLOBIN: 9.9 g/dL — AB (ref 12.0–15.0)
Potassium: 3.9 mmol/L (ref 3.5–5.1)
Sodium: 144 mmol/L (ref 135–145)
TCO2: 24 mmol/L (ref 22–32)

## 2018-03-01 LAB — CREATININE, SERUM
Creatinine, Ser: 0.75 mg/dL (ref 0.44–1.00)
GFR calc non Af Amer: >60 mL/min (ref >60)

## 2018-03-01 LAB — POCT I-STAT 4, (NA,K, GLUC, HGB,HCT)
Glucose, Bld: 97 mg/dL (ref 65–99)
HEMATOCRIT: 25 % — AB (ref 36.0–46.0)
HEMOGLOBIN: 8.5 g/dL — AB (ref 12.0–15.0)
Potassium: 3.4 mmol/L — ABNORMAL LOW (ref 3.5–5.1)
SODIUM: 144 mmol/L (ref 135–145)

## 2018-03-01 LAB — HEMOGLOBIN AND HEMATOCRIT, BLOOD
HCT: 23.6 % — ABNORMAL LOW (ref 36.0–46.0)
HEMOGLOBIN: 7.5 g/dL — AB (ref 12.0–15.0)

## 2018-03-01 LAB — PREPARE RBC (CROSSMATCH)

## 2018-03-01 LAB — PLATELET COUNT: PLATELETS: 103 10*3/uL — AB (ref 150–400)

## 2018-03-01 LAB — MAGNESIUM: Magnesium: 3.6 mg/dL — ABNORMAL HIGH (ref 1.7–2.4)

## 2018-03-01 LAB — PROTIME-INR
INR: 1.41
PROTHROMBIN TIME: 17.1 s — AB (ref 11.4–15.2)

## 2018-03-01 LAB — APTT: aPTT: 34 seconds (ref 24–36)

## 2018-03-01 SURGERY — REPAIR, MITRAL VALVE, MINIMALLY INVASIVE
Anesthesia: General | Site: Chest | Laterality: Right

## 2018-03-01 MED ORDER — POTASSIUM CHLORIDE 10 MEQ/50ML IV SOLN
10.0000 meq | INTRAVENOUS | Status: AC
  Administered 2018-03-01 (×3): 10 meq via INTRAVENOUS

## 2018-03-01 MED ORDER — BUPIVACAINE LIPOSOME 1.3 % IJ SUSP
20.0000 mL | INTRAMUSCULAR | Status: AC
  Administered 2018-03-01: 20 mL
  Filled 2018-03-01: qty 20

## 2018-03-01 MED ORDER — MORPHINE SULFATE (PF) 2 MG/ML IV SOLN: 1.0000 mg | INTRAVENOUS | Status: DC | PRN

## 2018-03-01 MED ORDER — METOPROLOL TARTRATE 5 MG/5ML IV SOLN: 2.5000 mg | INTRAVENOUS | Status: DC | PRN

## 2018-03-01 MED ORDER — MIDAZOLAM HCL 5 MG/5ML IJ SOLN
INTRAMUSCULAR | Status: DC | PRN
  Administered 2018-03-01: 1 mg via INTRAVENOUS
  Administered 2018-03-01: 2 mg via INTRAVENOUS
  Administered 2018-03-01: 4 mg via INTRAVENOUS
  Administered 2018-03-01: 2 mg via INTRAVENOUS
  Administered 2018-03-01: 1 mg via INTRAVENOUS

## 2018-03-01 MED ORDER — HEPARIN SODIUM (PORCINE) 1000 UNIT/ML IJ SOLN
INTRAMUSCULAR | Status: AC
  Filled 2018-03-01: qty 1

## 2018-03-01 MED ORDER — CHLORHEXIDINE GLUCONATE CLOTH 2 % EX PADS
6.0000 | MEDICATED_PAD | Freq: Every day | CUTANEOUS | Status: DC
  Administered 2018-03-01 – 2018-03-05 (×4): 6 via TOPICAL

## 2018-03-01 MED ORDER — METOPROLOL TARTRATE 12.5 MG HALF TABLET: 12.5000 mg | ORAL_TABLET | Freq: Two times a day (BID) | ORAL | Status: DC

## 2018-03-01 MED ORDER — SODIUM CHLORIDE 0.9 % IJ SOLN
INTRAMUSCULAR | Status: DC | PRN
  Administered 2018-03-01: 50 mL via INTRAVENOUS

## 2018-03-01 MED ORDER — SODIUM CHLORIDE 0.9 % IV SOLN
INTRAVENOUS | Status: DC
  Administered 2018-03-01: 14:00:00 via INTRAVENOUS

## 2018-03-01 MED ORDER — SODIUM CHLORIDE 0.9% FLUSH
10.0000 mL | Freq: Two times a day (BID) | INTRAVENOUS | Status: DC
  Administered 2018-03-01 – 2018-03-04 (×4): 10 mL

## 2018-03-01 MED ORDER — DEXMEDETOMIDINE HCL IN NACL 200 MCG/50ML IV SOLN: 0.0000 ug/kg/h | INTRAVENOUS | Status: DC

## 2018-03-01 MED ORDER — TRANEXAMIC ACID 1000 MG/10ML IV SOLN
INTRAVENOUS | Status: DC | PRN
  Administered 2018-03-01: 1.5 mg/kg/h via INTRAVENOUS

## 2018-03-01 MED ORDER — ORAL CARE MOUTH RINSE: 15.0000 mL | OROMUCOSAL | Status: DC

## 2018-03-01 MED ORDER — SODIUM CHLORIDE 0.9% FLUSH: 3.0000 mL | INTRAVENOUS | Status: DC | PRN

## 2018-03-01 MED ORDER — ROCURONIUM BROMIDE 100 MG/10ML IV SOLN
INTRAVENOUS | Status: DC | PRN
  Administered 2018-03-01 (×4): 50 mg via INTRAVENOUS

## 2018-03-01 MED ORDER — FENTANYL CITRATE (PF) 250 MCG/5ML IJ SOLN
INTRAMUSCULAR | Status: DC | PRN
  Administered 2018-03-01 (×2): 100 ug via INTRAVENOUS
  Administered 2018-03-01: 150 ug via INTRAVENOUS
  Administered 2018-03-01: 250 ug via INTRAVENOUS
  Administered 2018-03-01: 600 ug via INTRAVENOUS
  Administered 2018-03-01: 50 ug via INTRAVENOUS

## 2018-03-01 MED ORDER — INSULIN REGULAR BOLUS VIA INFUSION
0.0000 [IU] | Freq: Three times a day (TID) | INTRAVENOUS | Status: DC
  Filled 2018-03-01: qty 10

## 2018-03-01 MED ORDER — HEPARIN SODIUM (PORCINE) 1000 UNIT/ML IJ SOLN
INTRAMUSCULAR | Status: DC | PRN
  Administered 2018-03-01: 25 mL via INTRAVENOUS

## 2018-03-01 MED ORDER — DOCUSATE SODIUM 100 MG PO CAPS
200.0000 mg | ORAL_CAPSULE | Freq: Every day | ORAL | Status: DC
  Administered 2018-03-02 – 2018-03-07 (×5): 200 mg via ORAL
  Filled 2018-03-01 (×6): qty 2

## 2018-03-01 MED ORDER — ONDANSETRON HCL 4 MG/2ML IJ SOLN
4.0000 mg | Freq: Four times a day (QID) | INTRAMUSCULAR | Status: DC | PRN
  Administered 2018-03-01 – 2018-03-07 (×5): 4 mg via INTRAVENOUS
  Filled 2018-03-01 (×5): qty 2

## 2018-03-01 MED ORDER — 0.9 % SODIUM CHLORIDE (POUR BTL) OPTIME
TOPICAL | Status: DC | PRN
  Administered 2018-03-01: 3000 mL

## 2018-03-01 MED ORDER — ALBUMIN HUMAN 5 % IV SOLN: 250.0000 mL | INTRAVENOUS | Status: AC | PRN

## 2018-03-01 MED ORDER — ACETAMINOPHEN 650 MG RE SUPP
650.0000 mg | Freq: Once | RECTAL | Status: AC
  Administered 2018-03-01: 650 mg via RECTAL

## 2018-03-01 MED ORDER — ORAL CARE MOUTH RINSE
15.0000 mL | Freq: Four times a day (QID) | OROMUCOSAL | Status: DC
  Administered 2018-03-01: 15 mL via OROMUCOSAL

## 2018-03-01 MED ORDER — NITROGLYCERIN IN D5W 200-5 MCG/ML-% IV SOLN: 0.0000 ug/min | INTRAVENOUS | Status: DC

## 2018-03-01 MED ORDER — SODIUM CHLORIDE 0.9 % IV SOLN
INTRAVENOUS | Status: DC | PRN
  Administered 2018-03-01: 750 mg via INTRAVENOUS

## 2018-03-01 MED ORDER — LEVOTHYROXINE SODIUM 88 MCG PO TABS
88.0000 ug | ORAL_TABLET | Freq: Every day | ORAL | Status: DC
  Administered 2018-03-02 – 2018-03-07 (×6): 88 ug via ORAL
  Filled 2018-03-01 (×6): qty 1

## 2018-03-01 MED ORDER — PANTOPRAZOLE SODIUM 40 MG PO TBEC
40.0000 mg | DELAYED_RELEASE_TABLET | Freq: Every day | ORAL | Status: DC
  Filled 2018-03-01 (×2): qty 1

## 2018-03-01 MED ORDER — PROTAMINE SULFATE 10 MG/ML IV SOLN
INTRAVENOUS | Status: AC
  Filled 2018-03-01: qty 25

## 2018-03-01 MED ORDER — SODIUM CHLORIDE 0.9% FLUSH
3.0000 mL | Freq: Two times a day (BID) | INTRAVENOUS | Status: DC
  Administered 2018-03-02 – 2018-03-04 (×3): 3 mL via INTRAVENOUS

## 2018-03-01 MED ORDER — DEXMEDETOMIDINE HCL 200 MCG/2ML IV SOLN
INTRAVENOUS | Status: DC | PRN
  Administered 2018-03-01: 0.2 ug/kg/h via INTRAVENOUS

## 2018-03-01 MED ORDER — LACTATED RINGERS IV SOLN: INTRAVENOUS | Status: DC

## 2018-03-01 MED ORDER — LACTATED RINGERS IV SOLN: 500.0000 mL | Freq: Once | INTRAVENOUS | Status: DC | PRN

## 2018-03-01 MED ORDER — VANCOMYCIN HCL IN DEXTROSE 1-5 GM/200ML-% IV SOLN
1000.0000 mg | Freq: Once | INTRAVENOUS | Status: AC
  Administered 2018-03-01: 1000 mg via INTRAVENOUS
  Filled 2018-03-01: qty 200

## 2018-03-01 MED ORDER — ACETAMINOPHEN 160 MG/5ML PO SOLN: 650.0000 mg | Freq: Once | ORAL | Status: AC

## 2018-03-01 MED ORDER — EPHEDRINE SULFATE 50 MG/ML IJ SOLN
INTRAMUSCULAR | Status: DC | PRN
  Administered 2018-03-01: 5 mg via INTRAVENOUS

## 2018-03-01 MED ORDER — SODIUM CHLORIDE 0.9 % IV SOLN
0.0000 ug/min | INTRAVENOUS | Status: DC
  Filled 2018-03-01: qty 2

## 2018-03-01 MED ORDER — MAGNESIUM SULFATE 4 GM/100ML IV SOLN
4.0000 g | Freq: Once | INTRAVENOUS | Status: AC
  Administered 2018-03-01: 4 g via INTRAVENOUS
  Filled 2018-03-01: qty 100

## 2018-03-01 MED ORDER — SODIUM CHLORIDE 0.9 % IV SOLN
INTRAVENOUS | Status: DC | PRN
  Administered 2018-03-01: 1 [IU]/h via INTRAVENOUS

## 2018-03-01 MED ORDER — FENTANYL CITRATE (PF) 250 MCG/5ML IJ SOLN
INTRAMUSCULAR | Status: AC
  Filled 2018-03-01: qty 25

## 2018-03-01 MED ORDER — LIDOCAINE HCL (CARDIAC) PF 100 MG/5ML IV SOSY
PREFILLED_SYRINGE | INTRAVENOUS | Status: DC | PRN
  Administered 2018-03-01: 20 mg via INTRAVENOUS

## 2018-03-01 MED ORDER — LACTATED RINGERS IV SOLN
INTRAVENOUS | Status: DC | PRN
  Administered 2018-03-01: 08:00:00 via INTRAVENOUS

## 2018-03-01 MED ORDER — ROCURONIUM BROMIDE 50 MG/5ML IV SOLN
INTRAVENOUS | Status: AC
  Filled 2018-03-01: qty 4

## 2018-03-01 MED ORDER — SODIUM CHLORIDE 0.9 % IV SOLN: INTRAVENOUS | Status: DC

## 2018-03-01 MED ORDER — SODIUM CHLORIDE 0.9 % IV SOLN
INTRAVENOUS | Status: DC
  Filled 2018-03-01: qty 1

## 2018-03-01 MED ORDER — CHLORHEXIDINE GLUCONATE 4 % EX LIQD: 30.0000 mL | CUTANEOUS | Status: DC

## 2018-03-01 MED ORDER — SODIUM CHLORIDE 0.9 % IR SOLN
Status: DC | PRN
  Administered 2018-03-01: 3000 mL

## 2018-03-01 MED ORDER — PROTAMINE SULFATE 10 MG/ML IV SOLN
INTRAVENOUS | Status: DC | PRN
  Administered 2018-03-01 (×2): 60 mg via INTRAVENOUS
  Administered 2018-03-01: 30 mg via INTRAVENOUS
  Administered 2018-03-01 (×2): 50 mg via INTRAVENOUS

## 2018-03-01 MED ORDER — EPHEDRINE SULFATE 50 MG/ML IJ SOLN
INTRAMUSCULAR | Status: AC
  Filled 2018-03-01: qty 1

## 2018-03-01 MED ORDER — SODIUM CHLORIDE 0.9 % IV SOLN
1.5000 g | Freq: Two times a day (BID) | INTRAVENOUS | Status: AC
  Administered 2018-03-01 – 2018-03-03 (×4): 1.5 g via INTRAVENOUS
  Filled 2018-03-01 (×4): qty 1.5

## 2018-03-01 MED ORDER — ASPIRIN 81 MG PO CHEW: 324.0000 mg | CHEWABLE_TABLET | Freq: Every day | ORAL | Status: DC

## 2018-03-01 MED ORDER — ALBUMIN HUMAN 5 % IV SOLN
INTRAVENOUS | Status: DC | PRN
  Administered 2018-03-01 (×2): via INTRAVENOUS

## 2018-03-01 MED ORDER — OXYCODONE HCL 5 MG PO TABS: 5.0000 mg | ORAL_TABLET | ORAL | Status: DC | PRN

## 2018-03-01 MED ORDER — ACETAMINOPHEN 160 MG/5ML PO SOLN: 1000.0000 mg | Freq: Four times a day (QID) | ORAL | Status: DC

## 2018-03-01 MED ORDER — CHLORHEXIDINE GLUCONATE 0.12% ORAL RINSE (MEDLINE KIT)
15.0000 mL | Freq: Two times a day (BID) | OROMUCOSAL | Status: DC
  Administered 2018-03-01: 15 mL via OROMUCOSAL

## 2018-03-01 MED ORDER — BISACODYL 10 MG RE SUPP: 10.0000 mg | Freq: Every day | RECTAL | Status: DC

## 2018-03-01 MED ORDER — PROPOFOL 10 MG/ML IV BOLUS
INTRAVENOUS | Status: AC
  Filled 2018-03-01: qty 20

## 2018-03-01 MED ORDER — METOPROLOL TARTRATE 25 MG/10 ML ORAL SUSPENSION: 12.5000 mg | Freq: Two times a day (BID) | ORAL | Status: DC

## 2018-03-01 MED ORDER — LACTATED RINGERS IV SOLN
INTRAVENOUS | Status: DC | PRN
  Administered 2018-03-01 (×2): via INTRAVENOUS

## 2018-03-01 MED ORDER — FAMOTIDINE IN NACL 20-0.9 MG/50ML-% IV SOLN
20.0000 mg | Freq: Two times a day (BID) | INTRAVENOUS | Status: DC
  Administered 2018-03-01: 20 mg via INTRAVENOUS

## 2018-03-01 MED ORDER — LIDOCAINE 2% (20 MG/ML) 5 ML SYRINGE
INTRAMUSCULAR | Status: AC
  Filled 2018-03-01: qty 5

## 2018-03-01 MED ORDER — BISACODYL 5 MG PO TBEC
10.0000 mg | DELAYED_RELEASE_TABLET | Freq: Every day | ORAL | Status: DC
  Administered 2018-03-02 – 2018-03-07 (×4): 10 mg via ORAL
  Filled 2018-03-01 (×5): qty 2

## 2018-03-01 MED ORDER — SODIUM CHLORIDE 0.45 % IV SOLN
INTRAVENOUS | Status: DC | PRN
  Administered 2018-03-01: 14:00:00 via INTRAVENOUS

## 2018-03-01 MED ORDER — SODIUM CHLORIDE 0.9 % IJ SOLN
INTRAMUSCULAR | Status: DC | PRN
  Administered 2018-03-01: 50 mL

## 2018-03-01 MED ORDER — TRAMADOL HCL 50 MG PO TABS
50.0000 mg | ORAL_TABLET | ORAL | Status: DC | PRN
  Administered 2018-03-01 – 2018-03-06 (×13): 100 mg via ORAL
  Filled 2018-03-01 (×14): qty 2

## 2018-03-01 MED ORDER — MORPHINE SULFATE (PF) 2 MG/ML IV SOLN
1.0000 mg | INTRAVENOUS | Status: DC | PRN
  Administered 2018-03-01: 2 mg via INTRAVENOUS
  Filled 2018-03-01: qty 1

## 2018-03-01 MED ORDER — LACTATED RINGERS IV SOLN
INTRAVENOUS | Status: DC | PRN
  Administered 2018-03-01 (×2): via INTRAVENOUS

## 2018-03-01 MED ORDER — MIDAZOLAM HCL 10 MG/2ML IJ SOLN
INTRAMUSCULAR | Status: AC
  Filled 2018-03-01: qty 2

## 2018-03-01 MED ORDER — SODIUM CHLORIDE 0.9% FLUSH
10.0000 mL | INTRAVENOUS | Status: DC | PRN
  Administered 2018-03-01: 10 mL
  Filled 2018-03-01: qty 40

## 2018-03-01 MED ORDER — SODIUM CHLORIDE 0.9 % IV SOLN: 250.0000 mL | INTRAVENOUS | Status: DC

## 2018-03-01 MED ORDER — ASPIRIN EC 325 MG PO TBEC
325.0000 mg | DELAYED_RELEASE_TABLET | Freq: Every day | ORAL | Status: DC
  Administered 2018-03-02: 325 mg via ORAL
  Filled 2018-03-01: qty 1

## 2018-03-01 MED ORDER — CHLORHEXIDINE GLUCONATE 0.12 % MT SOLN
15.0000 mL | Freq: Once | OROMUCOSAL | Status: AC
  Administered 2018-03-01: 15 mL via OROMUCOSAL
  Filled 2018-03-01: qty 15

## 2018-03-01 MED ORDER — CHLORHEXIDINE GLUCONATE 0.12 % MT SOLN
15.0000 mL | OROMUCOSAL | Status: AC
  Administered 2018-03-01: 15 mL via OROMUCOSAL

## 2018-03-01 MED ORDER — MIDAZOLAM HCL 2 MG/2ML IJ SOLN: 2.0000 mg | INTRAMUSCULAR | Status: DC | PRN

## 2018-03-01 MED ORDER — ORAL CARE MOUTH RINSE: 15.0000 mL | Freq: Two times a day (BID) | OROMUCOSAL | Status: DC

## 2018-03-01 MED ORDER — ACETAMINOPHEN 500 MG PO TABS
1000.0000 mg | ORAL_TABLET | Freq: Four times a day (QID) | ORAL | Status: AC
  Administered 2018-03-01 – 2018-03-03 (×7): 1000 mg via ORAL
  Filled 2018-03-01 (×9): qty 2

## 2018-03-01 MED ORDER — METOPROLOL TARTRATE 12.5 MG HALF TABLET: 12.5000 mg | ORAL_TABLET | Freq: Once | ORAL | Status: DC

## 2018-03-01 SURGICAL SUPPLY — 112 items
ADAPTER CARDIO PERF ANTE/RETRO (ADAPTER) ×3 IMPLANT
ADH SKN CLS APL DERMABOND .7 (GAUZE/BANDAGES/DRESSINGS) ×4
ADPR PRFSN 84XANTGRD RTRGD (ADAPTER) ×2
BAG DECANTER FOR FLEXI CONT (MISCELLANEOUS) ×5 IMPLANT
BLADE STERNUM SYSTEM 6 (BLADE) ×1 IMPLANT
BLADE SURG 11 STRL SS (BLADE) ×3 IMPLANT
CANISTER SUCT 3000ML PPV (MISCELLANEOUS) ×6 IMPLANT
CANNULA FEM VENOUS REMOTE 22FR (CANNULA) ×1 IMPLANT
CANNULA FEMORAL ART 14 SM (MISCELLANEOUS) ×3 IMPLANT
CANNULA GUNDRY RCSP 15FR (MISCELLANEOUS) ×3 IMPLANT
CANNULA OPTISITE PERFUSION 16F (CANNULA) IMPLANT
CANNULA OPTISITE PERFUSION 18F (CANNULA) ×1 IMPLANT
CANNULA SUMP PERICARDIAL (CANNULA) ×6 IMPLANT
CATH KIT ON Q 5IN SLV (PAIN MANAGEMENT) IMPLANT
CELLS DAT CNTRL 66122 CELL SVR (MISCELLANEOUS) ×2 IMPLANT
CONN ST 1/4X3/8  BEN (MISCELLANEOUS) ×2
CONN ST 1/4X3/8 BEN (MISCELLANEOUS) ×4 IMPLANT
CONNECTOR 1/2X3/8X1/2 3 WAY (MISCELLANEOUS) ×1
CONNECTOR 1/2X3/8X1/2 3WAY (MISCELLANEOUS) ×2 IMPLANT
CONT SPEC 4OZ CLIKSEAL STRL BL (MISCELLANEOUS) ×4 IMPLANT
COVER BACK TABLE 24X17X13 BIG (DRAPES) ×3 IMPLANT
CRADLE DONUT ADULT HEAD (MISCELLANEOUS) ×3 IMPLANT
DERMABOND ADVANCED (GAUZE/BANDAGES/DRESSINGS) ×2
DERMABOND ADVANCED .7 DNX12 (GAUZE/BANDAGES/DRESSINGS) ×4 IMPLANT
DEVICE PMI PUNCTURE CLOSURE (MISCELLANEOUS) ×3 IMPLANT
DEVICE SUT CK QUICK LOAD MINI (Prosthesis & Implant Heart) ×2 IMPLANT
DEVICE TROCAR PUNCTURE CLOSURE (ENDOMECHANICALS) ×3 IMPLANT
DRAIN CHANNEL 28F RND 3/8 FF (WOUND CARE) ×6 IMPLANT
DRAPE BILATERAL SPLIT (DRAPES) ×3 IMPLANT
DRAPE C-ARM 42X72 X-RAY (DRAPES) ×3 IMPLANT
DRAPE CV SPLIT W-CLR ANES SCRN (DRAPES) ×3 IMPLANT
DRAPE INCISE IOBAN 66X45 STRL (DRAPES) ×9 IMPLANT
DRAPE SLUSH/WARMER DISC (DRAPES) ×3 IMPLANT
DRSG COVADERM 4X8 (GAUZE/BANDAGES/DRESSINGS) ×3 IMPLANT
ELECT BLADE 6.5 EXT (BLADE) ×3 IMPLANT
ELECT REM PT RETURN 9FT ADLT (ELECTROSURGICAL) ×6
ELECTRODE REM PT RTRN 9FT ADLT (ELECTROSURGICAL) ×4 IMPLANT
FELT TEFLON 1X6 (MISCELLANEOUS) ×5 IMPLANT
FEMORAL VENOUS CANN RAP (CANNULA) IMPLANT
GAUZE SPONGE 4X4 12PLY STRL (GAUZE/BANDAGES/DRESSINGS) ×1 IMPLANT
GLOVE BIOGEL PI IND STRL 6 (GLOVE) IMPLANT
GLOVE BIOGEL PI IND STRL 6.5 (GLOVE) IMPLANT
GLOVE BIOGEL PI IND STRL 8.5 (GLOVE) IMPLANT
GLOVE BIOGEL PI INDICATOR 6 (GLOVE) ×2
GLOVE BIOGEL PI INDICATOR 6.5 (GLOVE) ×3
GLOVE BIOGEL PI INDICATOR 8.5 (GLOVE) ×2
GLOVE ECLIPSE 8.0 STRL XLNG CF (GLOVE) ×2 IMPLANT
GLOVE ORTHO TXT STRL SZ7.5 (GLOVE) ×9 IMPLANT
GOWN SPEC L3 XXLG W/TWL (GOWN DISPOSABLE) ×1 IMPLANT
GOWN STRL REUS W/ TWL LRG LVL3 (GOWN DISPOSABLE) ×8 IMPLANT
GOWN STRL REUS W/TWL LRG LVL3 (GOWN DISPOSABLE) ×18
KIT BASIN OR (CUSTOM PROCEDURE TRAY) ×3 IMPLANT
KIT DILATOR VASC 18G NDL (KITS) ×3 IMPLANT
KIT DRAINAGE VACCUM ASSIST (KITS) ×1 IMPLANT
KIT SUCTION CATH 14FR (SUCTIONS) ×3 IMPLANT
KIT SUT CK MINI COMBO 4X17 (Prosthesis & Implant Heart) ×1 IMPLANT
KIT TURNOVER KIT B (KITS) ×3 IMPLANT
LEAD PACING MYOCARDI (MISCELLANEOUS) ×3 IMPLANT
LINE VENT (MISCELLANEOUS) ×1 IMPLANT
NDL AORTIC ROOT 14G 7F (CATHETERS) ×2 IMPLANT
NDL SPNL 18GX3.5 QUINCKE PK (NEEDLE) IMPLANT
NEEDLE 22X1 1/2 (OR ONLY) (NEEDLE) ×1 IMPLANT
NEEDLE AORTIC AIR ASPIRATING (NEEDLE) IMPLANT
NEEDLE AORTIC ROOT 14G 7F (CATHETERS) ×3 IMPLANT
NEEDLE SPNL 18GX3.5 QUINCKE PK (NEEDLE) ×3 IMPLANT
NS IRRIG 1000ML POUR BTL (IV SOLUTION) ×13 IMPLANT
PACK OPEN HEART (CUSTOM PROCEDURE TRAY) ×3 IMPLANT
PAD ARMBOARD 7.5X6 YLW CONV (MISCELLANEOUS) ×6 IMPLANT
PAD ELECT DEFIB RADIOL ZOLL (MISCELLANEOUS) ×3 IMPLANT
RETRACTOR WND ALEXIS 18 MED (MISCELLANEOUS) ×2 IMPLANT
RING MITRAL MEMO 4D 32 (Prosthesis & Implant Heart) ×1 IMPLANT
RTRCTR WOUND ALEXIS 18CM MED (MISCELLANEOUS) ×3
SET CANNULATION TOURNIQUET (MISCELLANEOUS) ×3 IMPLANT
SET CARDIOPLEGIA MPS 5001102 (MISCELLANEOUS) ×1 IMPLANT
SET IRRIG TUBING LAPAROSCOPIC (IRRIGATION / IRRIGATOR) ×3 IMPLANT
SOLUTION ANTI FOG 6CC (MISCELLANEOUS) ×3 IMPLANT
SPONGE GAUZE 4X4 12PLY STER LF (GAUZE/BANDAGES/DRESSINGS) ×3 IMPLANT
SUT BONE WAX W31G (SUTURE) ×3 IMPLANT
SUT E-PACK MINIMALLY INVASIVE (SUTURE) ×3 IMPLANT
SUT ETHIBOND (SUTURE) ×2 IMPLANT
SUT ETHIBOND 2 0 SH (SUTURE) ×1 IMPLANT
SUT ETHIBOND 2 0 V4 (SUTURE) IMPLANT
SUT ETHIBOND 2 0V4 GREEN (SUTURE) IMPLANT
SUT ETHIBOND 2-0 RB-1 WHT (SUTURE) ×2 IMPLANT
SUT ETHIBOND X763 2 0 SH 1 (SUTURE) ×3 IMPLANT
SUT GORETEX CV 4 TH 22 36 (SUTURE) ×3 IMPLANT
SUT GORETEX CV4 TH-18 (SUTURE) ×6 IMPLANT
SUT PROLENE 3 0 SH1 36 (SUTURE) ×12 IMPLANT
SUT PROLENE 5 0 C 1 36 (SUTURE) ×1 IMPLANT
SUT PTFE CHORD X 20MM (SUTURE) ×2 IMPLANT
SUT SILK  1 MH (SUTURE) ×1
SUT SILK 1 MH (SUTURE) IMPLANT
SUT SILK 2 0 SH CR/8 (SUTURE) IMPLANT
SUT SILK 3 0 SH CR/8 (SUTURE) IMPLANT
SUT VIC AB 2-0 CTX 36 (SUTURE) IMPLANT
SUT VIC AB 3-0 SH 8-18 (SUTURE) IMPLANT
SUT VICRYL 2 TP 1 (SUTURE) IMPLANT
SYR 10ML LL (SYRINGE) ×2 IMPLANT
SYSTEM SAHARA CHEST DRAIN ATS (WOUND CARE) ×3 IMPLANT
TAPE CLOTH SURG 4X10 WHT LF (GAUZE/BANDAGES/DRESSINGS) ×1 IMPLANT
TOWEL GREEN STERILE (TOWEL DISPOSABLE) ×3 IMPLANT
TOWEL GREEN STERILE FF (TOWEL DISPOSABLE) ×3 IMPLANT
TRAY FOLEY SILVER 16FR TEMP (SET/KITS/TRAYS/PACK) ×3 IMPLANT
TROCAR XCEL BLADELESS 5X75MML (TROCAR) ×3 IMPLANT
TROCAR XCEL NON-BLD 11X100MML (ENDOMECHANICALS) ×6 IMPLANT
TUBE SUCT INTRACARD DLP 20F (MISCELLANEOUS) ×3 IMPLANT
TUNNELER SHEATH ON-Q 11GX8 DSP (PAIN MANAGEMENT) IMPLANT
UNDERPAD 30X30 (UNDERPADS AND DIAPERS) ×3 IMPLANT
WATER STERILE IRR 1000ML POUR (IV SOLUTION) ×6 IMPLANT
WIRE .035 3MM-J 145CM (WIRE) ×3 IMPLANT
WIRE EMERALD 3MM-J .025X260CM (WIRE) IMPLANT
WIRE EMERALD 3MM-J .035X150CM (WIRE) ×1 IMPLANT

## 2018-03-01 NOTE — Anesthesia Procedure Notes (Signed)
Procedure Name: Intubation Date/Time: 03/01/2018 8:57 AM Performed by: Madailein Londo T, CRNA Pre-anesthesia Checklist: Patient identified, Emergency Drugs available, Suction available and Patient being monitored Patient Re-evaluated:Patient Re-evaluated prior to induction Oxygen Delivery Method: Circle system utilized Preoxygenation: Pre-oxygenation with 100% oxygen Induction Type: IV induction Ventilation: Mask ventilation without difficulty and Oral airway inserted - appropriate to patient size Laryngoscope Size: Miller and 3 Grade View: Grade I Endobronchial tube: Left, Double lumen EBT, EBT position confirmed by auscultation and EBT position confirmed by fiberoptic bronchoscope and 37 Fr Number of attempts: 1 Airway Equipment and Method: Patient positioned with wedge pillow and Stylet Placement Confirmation: ETT inserted through vocal cords under direct vision,  positive ETCO2 and breath sounds checked- equal and bilateral Tube secured with: Tape Dental Injury: Teeth and Oropharynx as per pre-operative assessment

## 2018-03-01 NOTE — Anesthesia Procedure Notes (Signed)
Arterial Line Insertion Start/End5/15/2019 7:50 AM, 03/01/2018 8:00 AM Performed by: Lavell Luster, CRNA, CRNA  Patient location: Pre-op. Lidocaine 1% used for infiltration and patient sedated Right, radial was placed Catheter size: 20 G Hand hygiene performed  and maximum sterile barriers used   Attempts: 1 Procedure performed without using ultrasound guided technique. Following insertion, dressing applied and Biopatch. Post procedure assessment: normal and unchanged  Patient tolerated the procedure well with no immediate complications.

## 2018-03-01 NOTE — Procedures (Signed)
Extubation Procedure Note  Patient Details:   Name: Tammy Montoya DOB: Oct 30, 1950 MRN: [id number]   Airway Documentation:  Airway 7.5 mm (Active)  Secured at (cm) 24 cm 03/01/2018  7:39 PM  Measured From Lips 03/01/2018  7:39 PM  Secured Location Right 03/01/2018  7:39 PM  Secured By Other (Comment) 03/01/2018  5:40 PM  Site Condition Dry 03/01/2018  7:39 PM   Vent end date: (not recorded) Vent end time: (not recorded)   Evaluation  O2 sats: stable throughout Complications: No apparent complications Patient did tolerate procedure well. Bilateral Breath Sounds: Clear   Yes   Pulmonary mechanics done prior to extubation. PT had 1L, -24 NIF, with a positive cuff leak and great effort. Pt extubated to 4 L nasal cannula and able to speak after extubation. Voice hoarse, pt completed 1000-1250 cc multiple times on IS after extubation. RT to cont to monitor.   Darryl Nestle F 03/01/2018, 8:58 PM

## 2018-03-01 NOTE — Anesthesia Procedure Notes (Signed)
Central Venous Catheter Insertion Performed by: Catalina Gravel, MD, anesthesiologist Start/End5/15/2019 7:55 AM, 03/01/2018 8:05 AM Patient location: Pre-op. Preanesthetic checklist: patient identified, IV checked, site marked, risks and benefits discussed, surgical consent, monitors and equipment checked, pre-op evaluation, timeout performed and anesthesia consent Position: Trendelenburg Lidocaine 1% used for infiltration and patient sedated Hand hygiene performed , maximum sterile barriers used  and Seldinger technique used Catheter size: 9 Fr Central line was placed.MAC introducer Swan type:thermodilution Procedure performed using ultrasound guided technique. Ultrasound Notes:anatomy identified, needle tip was noted to be adjacent to the nerve/plexus identified, no ultrasound evidence of intravascular and/or intraneural injection and image(s) printed for medical record Attempts: 1 Following insertion, line sutured and dressing applied. Post procedure assessment: blood return through all ports, free fluid flow and no air  Patient tolerated the procedure well with no immediate complications.

## 2018-03-01 NOTE — Transfer of Care (Signed)
Immediate Anesthesia Transfer of Care Note  Patient: Tammy Montoya  Procedure(s) Performed: MINIMALLY INVASIVE MITRAL VALVE REPAIR (MVR) (Right Chest) TRANSESOPHAGEAL ECHOCARDIOGRAM (TEE) (N/A )  Patient Location: ICU  Anesthesia Type:General  Level of Consciousness: Patient remains intubated per anesthesia plan  Airway & Oxygen Therapy: Patient remains intubated per anesthesia plan and Patient placed on Ventilator (see vital sign flow sheet for setting)  Post-op Assessment: Report given to RN and Post -op Vital signs reviewed and stable  Post vital signs: Reviewed and stable  Last Vitals:  Vitals Value Taken Time  BP    Temp 34.2 C 03/01/2018  2:50 PM  Pulse 80 03/01/2018  2:50 PM  Resp 22 03/01/2018  2:50 PM  SpO2 99 % 03/01/2018  2:50 PM  Vitals shown include unvalidated device data.  Last Pain:  Vitals:   03/01/18 1430  TempSrc: Core  PainSc:          Complications: No apparent anesthesia complications

## 2018-03-01 NOTE — Interval H&P Note (Signed)
History and Physical Interval Note:  03/01/2018 6:50 AM  Tammy Montoya  has presented today for surgery, with the diagnosis of MR  The various methods of treatment have been discussed with the patient and family. After consideration of risks, benefits and other options for treatment, the patient has consented to  Procedure(s): MINIMALLY INVASIVE MITRAL VALVE REPAIR (MVR) (Right) TRANSESOPHAGEAL ECHOCARDIOGRAM (TEE) (N/A) as a surgical intervention .  The patient's history has been reviewed, patient examined, no change in status, stable for surgery.  I have reviewed the patient's chart and labs.  Questions were answered to the patient's satisfaction.     [name]

## 2018-03-01 NOTE — Progress Notes (Signed)
Set pt up on CPAP with pressure of 5 and pt is wearing nasal pillows from home pt tolerating well. SpO2-97%, 80 HR. RT to monitor as needed.

## 2018-03-01 NOTE — Progress Notes (Signed)
[facility] BRIEF SICU PROGRESS NOTE  Day of Surgery  S/P Procedure(s) (LRB): MINIMALLY INVASIVE MITRAL VALVE REPAIR (MVR) (Right) TRANSESOPHAGEAL ECHOCARDIOGRAM (TEE) (N/A)   Waking up on vent NSR - AAI paced w/ stable hemodynamics, no drips O2 sats 100% Chest tube output low UOP excellent Labs okay  Plan: Continue routine early postop  [name], MD 03/01/2018 7:43 PM

## 2018-03-01 NOTE — Op Note (Signed)
CARDIOTHORACIC SURGERY OPERATIVE NOTE  Date of Procedure:  03/01/2018  Preoperative Diagnosis: Severe Mitral Regurgitation  Postoperative Diagnosis: Same  Procedure:    Minimally-Invasive Mitral Valve Repair  Complex valvuloplasty including artificial Gore-tex neochord placement x12  Sorin Memo 4D Ring Annuloplasty (size 91mm, catalog # [id number], serial # [id number])    Surgeon: [name], MD  Assistant: Nani Skillern, PA-C  Anesthesia: Midge Minium, MD  Operative Findings:  Fibroelastic deficiency type myxomatous degenerative disease  Multiple ruptured primary chordae tendinae with flail segment of posterior leaflet  Type II dysfunction with severe mitral regurgitation  Normal left ventricular systolic function  No residual mitral regurgitation after successful valve repair                BRIEF CLINICAL NOTE AND INDICATIONS FOR SURGERY  Patient is a 67 year old female with history of mitral valve prolapse, rheumatic fever during childhood, frequent PVCs and palpitations, obstructive sleep apnea,and hypothyroidism who has been referred for surgical consultation to discuss treatment options for management of mitral valve prolapse with severe primary mitral regurgitation.  The patient states that she had rheumatic fever during childhood. She was diagnosed with mitral valve prolapse more than 30 years ago. She has otherwise remained healthy and fairly active physically until several months ago when she began to experience palpitations and fatigue.She underwent a sleep study and was diagnosed with obstructive sleep apnea. She was referred for cardiology consultation and evaluated by Dr. Stanford Breed in early December. Transthoracic echocardiogram performed October 21, 2017 revealed normal left ventricular size and systolic function with mitral valve prolapse and moderate to severe mitral regurgitation. There was moderate to severe left atrial  enlargement and findings consistent with mild pulmonary hypertension. The patient subsequently underwent transesophageal echocardiogram on December 09, 2017. This confirmed the presence of mitral valve prolapse with severe prolapse involving the middle scallop of the posterior leaflet. There was severe mitral regurgitation. There was normal left ventricular systolic function. There was moderate to severe left atrial enlargement. No other significant abnormalities were noted. Patient was referred for surgical consultation.  The patient has been seen in consultation and counseled at length regarding the indications, risks and potential benefits of surgery.  All questions have been answered, and the patient provides full informed consent for the operation as described.    DETAILS OF THE OPERATIVE PROCEDURE  Preparation:  The patient is brought to the operating room on the above mentioned date and central monitoring was established by the anesthesia team including placement of Swan-Ganz catheter through the left internal jugular vein.  A radial arterial line is placed. The patient is placed in the supine position on the operating table.  Intravenous antibiotics are administered. General endotracheal anesthesia is induced uneventfully. The patient is initially intubated using a dual lumen endotracheal tube.  A Foley catheter is placed.  Baseline transesophageal echocardiogram was performed.  Findings were notable for myxomatous degenerative disease with an obvious flail segment involving the middle scallop of the posterior leaflet of the mitral valve.  There was severe mitral regurgitation.  There was normal left ventricular systolic function.  Right ventricular size and function was normal.  The tricuspid annulus was not dilated.  There was trace tricuspid regurgitation.  The aortic valve is normal.  A soft roll is placed behind the patient's left scapula and the neck gently extended and turned to the  left.   The patient's right neck, chest, abdomen, both groins, and both lower extremities are prepared and draped in a sterile  manner. A time out procedure is performed.  Surgical Approach:  A right miniature anterolateral thoracotomy incision is performed. The incision is placed just lateral to and superior to the right nipple. The pectoralis major muscle is retracted medially and completely preserved. The right pleural space is entered through the 3rd intercostal space. A soft tissue retractor is placed.  Two 11 mm ports are placed through separate stab incisions inferiorly. The right pleural space is insufflated continuously with carbon dioxide gas through the posterior port during the remainder of the operation.  A pledgeted sutures placed through the dome of the right hemidiaphragm and retracted inferiorly to facilitate exposure.  A longitudinal incision is made in the pericardium 3 cm anterior to the phrenic nerve and silk traction sutures are placed on either side of the incision for exposure.   Extracorporeal Cardiopulmonary Bypass and Myocardial Protection:  A small incision is made in the right inguinal crease and the anterior surface of the right common femoral artery and right common femoral vein are identified.  The patient is placed in Trendelenburg position. The right internal jugular vein is cannulated with Seldinger technique and a guidewire advanced into the right atrium. The patient is heparinized systemically. The right internal jugular vein is cannulated with a 14 [place] pediatric femoral venous cannula. Pursestring sutures are placed on the anterior surface of the right common femoral vein and right common femoral artery. The right common femoral vein is cannulated with the Seldinger technique and a guidewire is advanced under transesophageal echocardiogram guidance through the right atrium. The femoral vein is cannulated with a long 22 French femoral venous cannula. The right common  femoral artery is cannulated with Seldinger technique and a flexible guidewire is advanced until it can be appreciated intraluminally in the descending thoracic aorta on transesophageal echocardiogram. The femoral artery is cannulated with an 18 French femoral arterial cannula.  Adequate heparinization is verified.     The entire pre-bypass portion of the operation was notable for stable hemodynamics.  Cardiopulmonary bypass was begun.  Vacuum assist venous drainage is utilized. The incision in the pericardium is extended in both directions. Venous drainage and exposure are notably excellent. A retrograde cardioplegia cannula is placed through the right atrium into the coronary sinus using transesophageal echocardiogram guidance.  An antegrade cardioplegia cannula is placed in the ascending aorta.    The patient is cooled to 28C systemic temperature.  The aortic cross clamp is applied and cardioplegia is delivered initially in an antegrade fashion through the aortic root using modified [place] cold blood cardioplegia ([place] blood cardioplegia protocol).   The initial cardioplegic arrest is rapid with early diastolic arrest.  Myocardial protection was felt to be excellent.   Mitral Valve Repair:  A left atriotomy incision was performed through the interatrial groove and extended partially across the back wall of the left atrium after opening the oblique sinus inferiorly.  The mitral valve is exposed using a self-retaining retractor.  The mitral valve was inspected and notable for fibroelastic deficiency type myxomatous degenerative disease.  There was an obvious flail segment involving the middle scallop of the posterior leaflet.  There were several ruptured primary chordae tendinae.  There was not a great deal of redundant leaflet tissue.  The remainder of the posterior leaflet was normal.  The anterior leaflet was normal.  The subvalvular apparatus was otherwise normal.  There was no  calcification.  Interrupted 2-0 Ethibond horizontal mattress sutures are placed circumferentially around the entire mitral valve annulus. The sutures  will ultimately be utilized for ring annuloplasty, and at this juncture there are utilized to suspend the valve symmetrically.  Artificial neochord placement was performed using Chord-X multi-strand CV-4 Goretex pre-measured loops.  The appropriate cord length was measured from corresponding normal length primary cords from the P1 segment of the posterior leaflet. The papillary muscle suture of a Chord-X multi-strand suture was placed through the head of the posterior papillary muscle in a horizontal mattress fashion and tied over Teflon felt pledgets. Each of the three pre-measured loops were then reimplanted into the free margin of the P2 segment of the posterior leaflet on the posterior side of midline.  The papillary muscle suture of a second Chord-X multi-strand suture was placed through the head of the anterior papillary muscle in a horizontal mattress fashion and tied over Teflon felt pledgets. Each of the three pre-measured loops were then reimplanted into the free margin of the P2 segment of the posterior leaflet on the anterior side of midline.  The valve was tested with saline and appeared competent even without ring annuloplasty complete. The valve was sized to a 32 mm annuloplasty ring, based upon the transverse distance between the left and right commissures and the height of the anterior leaflet, corresponding to a size just slightly larger than the overall surface area of the anterior leaflet.  A Sorin Memo 4D annuloplasty ring (size 22mm, catalog #[id number], serial [id number]) was secured in place uneventfully. All ring sutures were secured using a Cor-knot device.    The valve was tested with saline and appeared competent. There is no residual leak. There was a broad, symmetrical line of coaptation of the anterior and posterior leaflet which was  confirmed using the blue ink test.  Rewarming is begun.   Procedure Completion:  The atriotomy was closed using a 2-layer closure of running 3-0 Prolene suture after placing a sump drain across the mitral valve to serve as a left ventricular vent.  One final dose of warm retrograde "reanimation dose" cardioplegia was administered retrograde through the coronary sinus catheter while all air was evacuated through the aortic root.  The aortic cross clamp was removed after a total cross clamp time of 93 minutes.  Epicardial pacing wires are fixed to the inferior wall of the right ventricule and to the right atrial appendage. The patient is rewarmed to 37C temperature. The left ventricular vent is removed.  The patient is ventilated and flow volumes turndown while the mitral valve repair is inspected using transesophageal echocardiogram. The valve repair appears intact with no residual leak. The antegrade cardioplegia cannula is now removed. The patient is weaned and disconnected from cardiopulmonary bypass.  The patient's rhythm at separation from bypass was sinus.  The patient was weaned from bypass without any inotropic support. Total cardiopulmonary bypass time for the operation was 130 minutes.  Followup transesophageal echocardiogram performed after separation from bypass revealed a well-seated annuloplasty ring in the mitral position with a normal functioning mitral valve. There was no residual leak.  Left ventricular function was unchanged from preoperatively.  The mean gradient across the mitral valve was estimated to be 3 mmHg.  The femoral arterial and venous cannulae were removed uneventfully. There was a palpable pulse in the distal right common femoral artery after removal of the cannula. Protamine was administered to reverse the anticoagulation. The right internal jugular cannula was removed and manual pressure held on the neck for 15 minutes.  Single lung ventilation was begun. The atriotomy  closure was inspected for hemostasis.  The pericardial sac was drained using a 28 French Bard drain placed through the anterior port incision.  The right pleural space is irrigated with saline solution and inspected for hemostasis.  Liposomal bupivacaine is injected using a spinal needle to anesthetize the third, fourth, and fifth intercostal nerve roots posteriorly.  The right pleural space was drained using a 28 French Bard drain placed through the posterior port incision. The miniature thoracotomy incision was closed in multiple layers in routine fashion. The right groin incision was inspected for hemostasis and closed in multiple layers in routine fashion.  The post-bypass portion of the operation was notable for stable rhythm and hemodynamics.  No blood products were administered during the operation.   Disposition:  The patient tolerated the procedure well.  The patient was reintubated using a single lumen endotracheal tube and subsequently transported to the surgical intensive care unit in stable condition. There were no intraoperative complications. All sponge instrument and needle counts are verified correct at completion of the operation.     [name] MD 03/01/2018 2:12 PM

## 2018-03-01 NOTE — Plan of Care (Signed)
  Problem: Clinical Measurements: Goal: Ability to maintain clinical measurements within normal limits will improve Outcome: Progressing Goal: Diagnostic test results will improve Outcome: Progressing Goal: Respiratory complications will improve Outcome: Progressing Note:  Patient extubated at 2045 to 4L. Goal: Cardiovascular complication will be avoided Outcome: Progressing   Problem: Elimination: Goal: Will not experience complications related to urinary retention Outcome: Progressing   Problem: Cardiac: Goal: Hemodynamic stability will improve Outcome: Progressing   Problem: Respiratory: Goal: Respiratory status will improve Outcome: Progressing

## 2018-03-01 NOTE — Anesthesia Procedure Notes (Signed)
Central Venous Catheter Insertion Performed by: Catalina Gravel, MD, anesthesiologist Start/End5/15/2019 8:05 AM, 03/01/2018 8:10 AM Patient location: Pre-op. Preanesthetic checklist: patient identified, IV checked, site marked, risks and benefits discussed, surgical consent, monitors and equipment checked, pre-op evaluation, timeout performed and anesthesia consent Hand hygiene performed  and maximum sterile barriers used  Total catheter length 100. PA cath was placed.Swan type:thermodilution PA Cath depth:58 Procedure performed without using ultrasound guided technique. Attempts: 1 Patient tolerated the procedure well with no immediate complications.

## 2018-03-01 NOTE — OR Nursing (Signed)
Twenty minute call to CVICU at 1337. Cath Lab also notified of timing.

## 2018-03-01 NOTE — OR Nursing (Signed)
Forty-five minute call to CVICU charge nurse at 1321.

## 2018-03-01 NOTE — Brief Op Note (Signed)
03/01/2018  2:10 PM  PATIENT:  Tammy Montoya  67 y.o. female  PRE-OPERATIVE DIAGNOSIS:  SEVERE MR  POST-OPERATIVE DIAGNOSIS:  SEVERE MR  PROCEDURE: TRANSESOPHAGEAL ECHOCARDIOGRAM (TEE), MINIMALLY INVASIVE MITRAL VALVE REPAIR (MVR) (using Model# 4DM-32, Serial # [id number],ZDGLO Memo 4D,size 32)  SURGEON:  Surgeon(s) and Role:    [name], MD - Primary  PHYSICIAN ASSISTANT: Lars Pinks PA-C  ASSISTANTS: Ara Kussmaul RNFA   ANESTHESIA:   general  EBL:  500 mL    DRAINS: Chest tubes placed in the right pleural space   LOCAL MEDICATIONS USED:  BUPIVICAINE   COUNTS CORRECTION:  YES  DICTATION: .Dragon Dictation  PLAN OF CARE: Admit to inpatient   PATIENT DISPOSITION:  ICU - intubated and hemodynamically stable.   Delay start of Pharmacological VTE agent (>24hrs) due to surgical blood loss or risk of bleeding: yes  BASELINE WEIGHT: 69.9 kg

## 2018-03-01 NOTE — Progress Notes (Signed)
  Echocardiogram Echocardiogram Transesophageal has been performed.  Tammy Montoya 03/01/2018, 10:38 AM

## 2018-03-01 NOTE — Anesthesia Procedure Notes (Signed)
Procedure Name: Intubation Date/Time: 03/01/2018 2:02 PM Performed by: Jakara Blatter T, Museum/gallery exhibitions officer Size: Glidescope and 4 Tube size: 7.5 mm Number of attempts: 1 Airway Equipment and Method: Video-laryngoscopy and Patient positioned with wedge pillow (tube exchanger) Placement Confirmation: ETT inserted through vocal cords under direct vision,  positive ETCO2 and breath sounds checked- equal and bilateral Secured at: 25 cm Tube secured with: Tape Dental Injury: Teeth and Oropharynx as per pre-operative assessment

## 2018-03-02 ENCOUNTER — Inpatient Hospital Stay (HOSPITAL_COMMUNITY): Payer: Medicare Other

## 2018-03-02 ENCOUNTER — Encounter (HOSPITAL_COMMUNITY): Payer: Self-pay | Admitting: Thoracic Surgery (Cardiothoracic Vascular Surgery)

## 2018-03-02 DIAGNOSIS — Z9889 Other specified postprocedural states: Secondary | ICD-10-CM

## 2018-03-02 DIAGNOSIS — E8779 Other fluid overload: Secondary | ICD-10-CM

## 2018-03-02 LAB — GLUCOSE, CAPILLARY
GLUCOSE-CAPILLARY: 104 mg/dL — AB (ref 65–99)
GLUCOSE-CAPILLARY: 105 mg/dL — AB (ref 65–99)
GLUCOSE-CAPILLARY: 123 mg/dL — AB (ref 65–99)
GLUCOSE-CAPILLARY: 132 mg/dL — AB (ref 65–99)
GLUCOSE-CAPILLARY: 139 mg/dL — AB (ref 65–99)
GLUCOSE-CAPILLARY: 142 mg/dL — AB (ref 65–99)
Glucose-Capillary: 114 mg/dL — ABNORMAL HIGH (ref 65–99)
Glucose-Capillary: 106 mg/dL — ABNORMAL HIGH (ref 65–99)
Glucose-Capillary: 107 mg/dL — ABNORMAL HIGH (ref 65–99)
Glucose-Capillary: 136 mg/dL — ABNORMAL HIGH (ref 65–99)

## 2018-03-02 LAB — POCT I-STAT, CHEM 8
BUN: 16 mg/dL (ref 6–20)
BUN: 12 mg/dL (ref 6–20)
BUN: 13 mg/dL (ref 6–20)
BUN: 13 mg/dL (ref 6–20)
BUN: 13 mg/dL (ref 6–20)
BUN: 10 mg/dL (ref 6–20)
CALCIUM ION: 1.22 mmol/L (ref 1.15–1.40)
CALCIUM ION: 1.11 mmol/L — AB (ref 1.15–1.40)
CHLORIDE: 104 mmol/L (ref 101–111)
CHLORIDE: 104 mmol/L (ref 101–111)
CREATININE: 0.5 mg/dL (ref 0.44–1.00)
Calcium, Ion: 0.98 mmol/L — ABNORMAL LOW (ref 1.15–1.40)
Calcium, Ion: 1.15 mmol/L (ref 1.15–1.40)
Calcium, Ion: 1.06 mmol/L — ABNORMAL LOW (ref 1.15–1.40)
Calcium, Ion: 1.03 mmol/L — ABNORMAL LOW (ref 1.15–1.40)
Chloride: 102 mmol/L (ref 101–111)
Chloride: 105 mmol/L (ref 101–111)
Chloride: 104 mmol/L (ref 101–111)
Chloride: 95 mmol/L — ABNORMAL LOW (ref 101–111)
Creatinine, Ser: 0.7 mg/dL (ref 0.44–1.00)
Creatinine, Ser: 0.6 mg/dL (ref 0.44–1.00)
Creatinine, Ser: 0.5 mg/dL (ref 0.44–1.00)
Creatinine, Ser: 0.5 mg/dL (ref 0.44–1.00)
Creatinine, Ser: 0.7 mg/dL (ref 0.44–1.00)
GLUCOSE: 107 mg/dL — AB (ref 65–99)
Glucose, Bld: 85 mg/dL (ref 65–99)
Glucose, Bld: 98 mg/dL (ref 65–99)
Glucose, Bld: 124 mg/dL — ABNORMAL HIGH (ref 65–99)
Glucose, Bld: 116 mg/dL — ABNORMAL HIGH (ref 65–99)
Glucose, Bld: 148 mg/dL — ABNORMAL HIGH (ref 65–99)
HCT: 32 % — ABNORMAL LOW (ref 36.0–46.0)
HEMATOCRIT: 25 % — AB (ref 36.0–46.0)
HEMATOCRIT: 28 % — AB (ref 36.0–46.0)
HEMATOCRIT: 23 % — AB (ref 36.0–46.0)
HEMATOCRIT: 22 % — AB (ref 36.0–46.0)
HEMATOCRIT: 28 % — AB (ref 36.0–46.0)
HEMOGLOBIN: 10.9 g/dL — AB (ref 12.0–15.0)
HEMOGLOBIN: 8.5 g/dL — AB (ref 12.0–15.0)
HEMOGLOBIN: 9.5 g/dL — AB (ref 12.0–15.0)
HEMOGLOBIN: 7.8 g/dL — AB (ref 12.0–15.0)
HEMOGLOBIN: 7.5 g/dL — AB (ref 12.0–15.0)
HEMOGLOBIN: 9.5 g/dL — AB (ref 12.0–15.0)
POTASSIUM: 3.5 mmol/L (ref 3.5–5.1)
POTASSIUM: 3.5 mmol/L (ref 3.5–5.1)
Potassium: 3.8 mmol/L (ref 3.5–5.1)
Potassium: 3.5 mmol/L (ref 3.5–5.1)
Potassium: 4.3 mmol/L (ref 3.5–5.1)
Potassium: 3.8 mmol/L (ref 3.5–5.1)
SODIUM: 142 mmol/L (ref 135–145)
SODIUM: 142 mmol/L (ref 135–145)
SODIUM: 140 mmol/L (ref 135–145)
SODIUM: 134 mmol/L — AB (ref 135–145)
Sodium: 142 mmol/L (ref 135–145)
Sodium: 142 mmol/L (ref 135–145)
TCO2: 30 mmol/L (ref 22–32)
TCO2: 28 mmol/L (ref 22–32)
TCO2: 28 mmol/L (ref 22–32)
TCO2: 27 mmol/L (ref 22–32)
TCO2: 27 mmol/L (ref 22–32)
TCO2: 27 mmol/L (ref 22–32)

## 2018-03-02 LAB — POCT I-STAT 3, ART BLOOD GAS (G3+)
ACID-BASE DEFICIT: 25 mmol/L — AB (ref 0.0–2.0)
ACID-BASE EXCESS: 2 mmol/L (ref 0.0–2.0)
BICARBONATE: 6.6 mmol/L — AB (ref 20.0–28.0)
Bicarbonate: 25.6 mmol/L (ref 20.0–28.0)
O2 Saturation: 100 %
O2 Saturation: 100 %
PCO2 ART: 35.2 mmHg (ref 32.0–48.0)
PO2 ART: 469 mmHg — AB (ref 83.0–108.0)
PO2 ART: 300 mmHg — AB (ref 83.0–108.0)
TCO2: 27 mmol/L (ref 22–32)
TCO2: 8 mmol/L — ABNORMAL LOW (ref 22–32)
pCO2 arterial: 33 mmHg (ref 32.0–48.0)
pH, Arterial: 7.498 — ABNORMAL HIGH (ref 7.350–7.450)
pH, Arterial: 6.884 — CL (ref 7.350–7.450)

## 2018-03-02 LAB — CBC
HCT: 30.2 % — ABNORMAL LOW (ref 36.0–46.0)
HEMATOCRIT: 28.4 % — AB (ref 36.0–46.0)
HEMOGLOBIN: 9.3 g/dL — AB (ref 12.0–15.0)
HEMOGLOBIN: 9.7 g/dL — AB (ref 12.0–15.0)
MCH: 33 pg (ref 26.0–34.0)
MCH: 32.7 pg (ref 26.0–34.0)
MCHC: 32.7 g/dL (ref 30.0–36.0)
MCHC: 32.1 g/dL (ref 30.0–36.0)
MCV: 100.7 fL — AB (ref 78.0–100.0)
MCV: 101.7 fL — AB (ref 78.0–100.0)
Platelets: 94 10*3/uL — ABNORMAL LOW (ref 150–400)
Platelets: 106 10*3/uL — ABNORMAL LOW (ref 150–400)
RBC: 2.82 MIL/uL — ABNORMAL LOW (ref 3.87–5.11)
RBC: 2.97 MIL/uL — AB (ref 3.87–5.11)
RDW: 12.4 % (ref 11.5–15.5)
RDW: 12.7 % (ref 11.5–15.5)
WBC: 10.8 10*3/uL — ABNORMAL HIGH (ref 4.0–10.5)
WBC: 12.8 10*3/uL — ABNORMAL HIGH (ref 4.0–10.5)

## 2018-03-02 LAB — MAGNESIUM
Magnesium: 2.6 mg/dL — ABNORMAL HIGH (ref 1.7–2.4)
Magnesium: 2.1 mg/dL (ref 1.7–2.4)

## 2018-03-02 LAB — BASIC METABOLIC PANEL
Anion gap: 8 (ref 5–15)
BUN: 9 mg/dL (ref 6–20)
CALCIUM: 7.3 mg/dL — AB (ref 8.9–10.3)
CHLORIDE: 107 mmol/L (ref 101–111)
CO2: 23 mmol/L (ref 22–32)
CREATININE: 0.74 mg/dL (ref 0.44–1.00)
GFR calc Af Amer: >60 mL/min (ref >60)
GFR calc non Af Amer: >60 mL/min (ref >60)
GLUCOSE: 112 mg/dL — AB (ref 65–99)
Potassium: 3.8 mmol/L (ref 3.5–5.1)
Sodium: 138 mmol/L (ref 135–145)

## 2018-03-02 LAB — CREATININE, SERUM
CREATININE: 0.82 mg/dL (ref 0.44–1.00)
GFR calc Af Amer: >60 mL/min (ref >60)
GFR calc non Af Amer: >60 mL/min (ref >60)

## 2018-03-02 MED ORDER — FUROSEMIDE 10 MG/ML IJ SOLN
20.0000 mg | Freq: Four times a day (QID) | INTRAMUSCULAR | Status: AC
  Administered 2018-03-02 (×3): 20 mg via INTRAVENOUS
  Filled 2018-03-02 (×4): qty 2

## 2018-03-02 MED ORDER — ENOXAPARIN SODIUM 40 MG/0.4ML ~~LOC~~ SOLN
40.0000 mg | Freq: Every day | SUBCUTANEOUS | Status: DC
  Administered 2018-03-03 – 2018-03-05 (×3): 40 mg via SUBCUTANEOUS
  Filled 2018-03-02 (×3): qty 0.4

## 2018-03-02 MED ORDER — METOCLOPRAMIDE HCL 5 MG/ML IJ SOLN
10.0000 mg | Freq: Four times a day (QID) | INTRAMUSCULAR | Status: DC
  Administered 2018-03-02 – 2018-03-05 (×10): 10 mg via INTRAVENOUS
  Filled 2018-03-02 (×11): qty 2

## 2018-03-02 MED ORDER — PROMETHAZINE HCL 25 MG/ML IJ SOLN
6.2500 mg | Freq: Four times a day (QID) | INTRAMUSCULAR | Status: DC | PRN
  Administered 2018-03-02 – 2018-03-06 (×7): 6.25 mg via INTRAVENOUS
  Filled 2018-03-02 (×6): qty 1

## 2018-03-02 MED ORDER — KETOROLAC TROMETHAMINE 15 MG/ML IJ SOLN
15.0000 mg | Freq: Four times a day (QID) | INTRAMUSCULAR | Status: AC
  Administered 2018-03-02 – 2018-03-03 (×4): 15 mg via INTRAVENOUS
  Filled 2018-03-02 (×4): qty 1

## 2018-03-02 MED ORDER — WARFARIN SODIUM 2.5 MG PO TABS
2.5000 mg | ORAL_TABLET | Freq: Every day | ORAL | Status: DC
  Administered 2018-03-02 – 2018-03-05 (×4): 2.5 mg via ORAL
  Filled 2018-03-02 (×4): qty 1

## 2018-03-02 MED ORDER — INSULIN ASPART 100 UNIT/ML ~~LOC~~ SOLN
0.0000 [IU] | SUBCUTANEOUS | Status: DC
  Administered 2018-03-02 (×3): 2 [IU] via SUBCUTANEOUS

## 2018-03-02 MED ORDER — POTASSIUM CHLORIDE 10 MEQ/50ML IV SOLN
10.0000 meq | INTRAVENOUS | Status: AC
  Administered 2018-03-02 (×3): 10 meq via INTRAVENOUS
  Filled 2018-03-02 (×3): qty 50

## 2018-03-02 MED ORDER — PROMETHAZINE HCL 25 MG/ML IJ SOLN
12.5000 mg | Freq: Four times a day (QID) | INTRAMUSCULAR | Status: DC | PRN
  Filled 2018-03-02: qty 1

## 2018-03-02 MED ORDER — WARFARIN - PHYSICIAN DOSING INPATIENT
Freq: Every day | Status: DC
  Administered 2018-03-03: 18:00:00

## 2018-03-02 NOTE — Care Management Note (Signed)
Case Management Note Marvetta Gibbons RN,BSN Unit [facility] Case Manager  [phone]  Patient Details  Name: Tammy Montoya MRN: [id number] Date of Birth: 05/27/1951  Subjective/Objective:   Pt admitted s/p mini MVR                Action/Plan: PTA pt lived at home with spouse- anticipate return home- CM to follow for transition of care needs.   Expected Discharge Date:                  Expected Discharge Plan:  Home/Self Care  In-House Referral:     Discharge planning Services  CM Consult  Post Acute Care Choice:    Choice offered to:     DME Arranged:    DME Agency:     HH Arranged:    HH Agency:     Status of Service:  In process, will continue to follow  If discussed at Long Length of Stay Meetings, dates discussed:    Discharge Disposition:   Additional Comments:  Dawayne Patricia, RN 03/02/2018, 12:16 PM

## 2018-03-02 NOTE — Progress Notes (Signed)
Progress Note  Patient Name: LAIANA FRATUS Date of Encounter: 03/02/2018  Primary Cardiologist: Dr Stanford Breed  Subjective   Chest sore; no dyspnea  Inpatient Medications    Scheduled Meds: . acetaminophen  1,000 mg Oral Q6H  . aspirin EC  325 mg Oral Daily  . bisacodyl  10 mg Oral Daily   Or  . bisacodyl  10 mg Rectal Daily  . Chlorhexidine Gluconate Cloth  6 each Topical Daily  . docusate sodium  200 mg Oral Daily  . [START ON 03/03/2018] enoxaparin (LOVENOX) injection  40 mg Subcutaneous QHS  . furosemide  20 mg Intravenous Q6H  . insulin aspart  0-24 Units Subcutaneous Q4H  . levothyroxine  88 mcg Oral QAC breakfast  . mouth rinse  15 mL Mouth Rinse BID  . [START ON 03/03/2018] pantoprazole  40 mg Oral Daily  . sodium chloride flush  10-40 mL Intracatheter Q12H  . sodium chloride flush  3 mL Intravenous Q12H  . warfarin  2.5 mg Oral q1800  . Warfarin - Physician Dosing Inpatient   Does not apply q1800   Continuous Infusions: . sodium chloride    . albumin human    . cefUROXime (ZINACEF)  IV Stopped (03/01/18 2021)  . lactated ringers 20 mL/hr at 03/01/18 2000  . potassium chloride 10 mEq (03/02/18 0655)   PRN Meds: albumin human, metoprolol tartrate, morphine injection, ondansetron (ZOFRAN) IV, oxyCODONE, sodium chloride flush, sodium chloride flush, traMADol   Vital Signs    Vitals:   03/02/18 0615 03/02/18 0630 03/02/18 0645 03/02/18 0700  BP:    115/62  Pulse: 80 80 80 80  Resp: 19 15 16 16   Temp: 97.9 F (36.6 C) 97.9 F (36.6 C) 98.1 F (36.7 C) 97.9 F (36.6 C)  TempSrc:      SpO2: 97% 96% 96% 99%  Weight:      Height:        Intake/Output Summary (Last 24 hours) at 03/02/2018 0729 Last data filed at 03/02/2018 0700 Gross per 24 hour  Intake 6377.65 ml  Output 6040 ml  Net 337.65 ml   Filed Weights   03/01/18 0703 03/02/18 0500  Weight: 154 lb (69.9 kg) 165 lb 5.5 oz (75 kg)    Telemetry    Atrial paced with pvcs and 3 beats NSVT -  Personally Reviewed  ECG    Sinus bradycardia-Personally Reviewed  Physical Exam   GEN: No acute distress.   Neck: No JVD Cardiac: RRR, no murmurs Respiratory: Mildly diminished BS; s/p sternotomy GI: Soft, nontender, non-distended  MS: No edema Neuro:  Nonfocal  Psych: Normal affect   Labs    Chemistry Recent Labs  Lab 02/27/18 1158 03/01/18 1434 03/01/18 2020 03/01/18 2056 03/02/18 0330  NA 137 144  --  144 138  K 4.4 3.4*  --  3.9 3.8  CL 106  --   --  108 107  CO2 20*  --   --   --  23  GLUCOSE 90 97  --  124* 112*  BUN 19  --   --  11 9  CREATININE 1.02*  --  0.75 0.60 0.74  CALCIUM 9.7  --   --   --  7.3*  PROT 7.1  --   --   --   --   ALBUMIN 4.3  --   --   --   --   AST 28  --   --   --   --  ALT 16  --   --   --   --   ALKPHOS 64  --   --   --   --   BILITOT 1.2  --   --   --   --   GFRNONAA 56*  --  >60  --  >60  GFRAA >60  --  >60  --  >60  ANIONGAP 11  --   --   --  8     Hematology Recent Labs  Lab 03/01/18 1429  03/01/18 2020 03/01/18 2056 03/02/18 0330  WBC 10.6*  --  10.1  --  10.8*  RBC 2.86*  --  3.09*  --  2.82*  HGB 9.4*   < > 10.0* 9.9* 9.3*  HCT 28.3*   < > 30.9* 29.0* 28.4*  MCV 99.0  --  100.0  --  100.7*  MCH 32.9  --  32.4  --  33.0  MCHC 33.2  --  32.4  --  32.7  RDW 12.2  --  12.3  --  12.4  PLT 92*  --  102*  --  94*   < > = values in this interval not displayed.     Radiology    Dg Chest Port 1 View  Result Date: 03/01/2018 CLINICAL DATA:  67 year old female postoperative day zero status post minimally invasive mitral valve repair. EXAM: PORTABLE CHEST 1 VIEW COMPARISON:  02/27/2018 and earlier. FINDINGS: Portable AP semi upright view at 1431 hours. Endotracheal tube tip in good position at the level the clavicles. Enteric tube courses to the abdomen, tip not included. Left IJ approach Swan-Ganz catheter is in place, tip is at the level of the distal right main pulmonary artery. Right chest tube in place, courses to  the apex. Epicardial pacer wires in place. Mildly lower lung volumes. Mild curvilinear opacity in the right mid lung compatible with atelectasis. No pneumothorax, pulmonary edema or pleural effusion identified. Stable mediastinal contours.  Prosthetic mitral valve in place. IMPRESSION: 1. Lines and tubes appear appropriately placed. 2. No pneumothorax or pulmonary edema.  Mild atelectasis. Electronically Signed   By: Genevie Ann M.D.   On: 03/01/2018 14:54     Patient Profile     67 y.o. female with severe extension now status post mitral valve repair.  Assessment & Plan    1 status post mitral valve repair-patient doing well this morning. Rhythm is atrial paced. Will arrange outpt baseline echo 3 months following DC.  2 postoperative volume excess-continue present dose of lasix.  3 postop anemia/thrombocytopenia-follow CBC.  4 H/O PVCs/palpitations-pt complained of fatigue with metoprolol previously; would resume bystolic 2.5 mg daily at DC.  For questions or updates, please contact [place] Please consult [email or web address] for contact info under Cardiology/STEMI.      Signed, Kirk Ruths, MD  03/02/2018, 7:29 AM

## 2018-03-02 NOTE — Discharge Instructions (Addendum)
Information on my medicine - Coumadin®   (Warfarin) ° °This medication education was reviewed with me or my healthcare representative as part of my discharge preparation. ° °Why was Coumadin prescribed for you? °Coumadin was prescribed for you because you have a blood clot or a medical condition that can cause an increased risk of forming blood clots. Blood clots can cause serious health problems by blocking the flow of blood to the heart, lung, or brain. Coumadin can prevent harmful blood clots from forming. °As a reminder your indication for Coumadin is:   Blood Clot Prevention After Heart Valve Surgery ° °What test will check on my response to Coumadin? °While on Coumadin (warfarin) you will need to have an INR test regularly to ensure that your dose is keeping you in the desired range. The INR (international normalized ratio) number is calculated from the result of the laboratory test called prothrombin time (PT). ° °If an INR APPOINTMENT HAS NOT ALREADY BEEN MADE FOR YOU please schedule an appointment to have this lab work done by your health care provider within 7 days. °Your INR goal is usually a number between:  2 to 3 or your provider may give you a more narrow range like 2-2.5.  Ask your health care provider during an office visit what your goal INR is. ° °What  do you need to  know  About  COUMADIN? °Take Coumadin (warfarin) exactly as prescribed by your healthcare provider about the same time each day.  DO NOT stop taking without talking to the doctor who prescribed the medication.  Stopping without other blood clot prevention medication to take the place of Coumadin may increase your risk of developing a new clot or stroke.  Get refills before you run out. ° °What do you do if you miss a dose? °If you miss a dose, take it as soon as you remember on the same day then continue your regularly scheduled regimen the next day.  Do not take two doses of Coumadin at the same time. ° °Important Safety  Information °A possible side effect of Coumadin (Warfarin) is an increased risk of bleeding. You should call your healthcare provider right away if you experience any of the following: °? Bleeding from an injury or your nose that does not stop. °? Unusual colored urine (red or dark brown) or unusual colored stools (red or black). °? Unusual bruising for unknown reasons. °? A serious fall or if you hit your head (even if there is no bleeding). ° °Some foods or medicines interact with Coumadin® (warfarin) and might alter your response to warfarin. To help avoid this: °? Eat a balanced diet, maintaining a consistent amount of Vitamin K. °? Notify your provider about major diet changes you plan to make. °? Avoid alcohol or limit your intake to 1 drink for women and 2 drinks for men per day. °(1 drink is 5 oz. wine, 12 oz. beer, or 1.5 oz. liquor.) ° °Make sure that ANY health care provider who prescribes medication for you knows that you are taking Coumadin (warfarin).  Also make sure the healthcare provider who is monitoring your Coumadin knows when you have started a new medication including herbals and non-prescription products. ° °Coumadin® (Warfarin)  Major Drug Interactions  °Increased Warfarin Effect Decreased Warfarin Effect  °Alcohol (large quantities) °Antibiotics (esp. Septra/Bactrim, Flagyl, Cipro) °Amiodarone (Cordarone) °Aspirin (ASA) °Cimetidine (Tagamet) °Megestrol (Megace) °NSAIDs (ibuprofen, naproxen, etc.) °Piroxicam (Feldene) °Propafenone (Rythmol SR) °Propranolol (Inderal) °Isoniazid (INH) °Posaconazole (Noxafil) Barbiturates (Phenobarbital) °Carbamazepine (  Phenobarbital) Carbamazepine (Tegretol) Chlordiazepoxide (Librium) Cholestyramine (Questran) Griseofulvin Oral Contraceptives Rifampin Sucralfate (Carafate) Vitamin K   Coumadin (Warfarin) Major Herbal Interactions  Increased Warfarin Effect Decreased Warfarin Effect  Garlic Ginseng Ginkgo biloba Coenzyme Q10 Green tea St. Johns wort    Coumadin (Warfarin)  FOOD Interactions  Eat a consistent number of servings per week of foods HIGH in Vitamin K (1 serving =  cup)  Collards (cooked, or boiled & drained) Kale (cooked, or boiled & drained) Mustard greens (cooked, or boiled & drained) Parsley *serving size only =  cup Spinach (cooked, or boiled & drained) Swiss chard (cooked, or boiled & drained) Turnip greens (cooked, or boiled & drained)  Eat a consistent number of servings per week of foods MEDIUM-HIGH in Vitamin K (1 serving = 1 cup)  Asparagus (cooked, or boiled & drained) Broccoli (cooked, boiled & drained, or raw & chopped) Brussel sprouts (cooked, or boiled & drained) *serving size only =  cup Lettuce, raw (green leaf, endive, romaine) Spinach, raw Turnip greens, raw & chopped   These websites have more information on Coumadin (warfarin):  FailFactory.se; VeganReport.com.au;   Discharge Instructions:  1. You may shower, please wash incisions daily with soap and water and keep dry.  If you wish to cover wounds with dressing you may do so but please keep clean and change daily.  No tub baths or swimming until incisions have completely healed.  If your incisions become red or develop any drainage please call our office at [phone]  2. No Driving until cleared by Dr. Jenna Luo office and you are no longer using narcotic pain medications  3. Monitor your weight daily.. Please use the same scale and weigh at same time... If you gain 5-10 lbs in 48 hours with associated lower extremity swelling, please contact our office at [phone]  4. Fever of 101.5 for at least 24 hours with no source, please contact our office at [phone]  5. Activity- up as tolerated, please walk at least 3 times per day.  Avoid strenuous activity,   6. If any questions or concerns arise, please do not hesitate to contact our office at [phone]

## 2018-03-02 NOTE — Progress Notes (Signed)
      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  Mild soreness in chest.  No SOB  Objective: Vital signs: BP Readings from Last 1 Encounters:  03/02/18 115/62   Pulse Readings from Last 1 Encounters:  03/02/18 80   Resp Readings from Last 1 Encounters:  03/02/18 16   Temp Readings from Last 1 Encounters:  03/02/18 97.9 F (36.6 C)    Hemodynamics: PAP: (20-37)/(10-20) 31/10 CO:  [4.4 L/min-6.6 L/min] 6.6 L/min CI:  [2.4 L/min/m2-3.6 L/min/m2] 3.6 L/min/m2  Physical Exam:  Rhythm:   Sinus brady  Breath sounds: clear  Heart sounds:  RRR w/out murmur  Incisions:  Dressings dry, intact  Abdomen:  Soft, non-distended, non-tender  Extremities:  Warm, well-perfused  Chest tubes:  low volume thin serosanguinous output, no air leak     Intake/Output from previous day: 05/15 0701 - 05/16 0700 In: 6377.7 [I.V.:5227.7; IV Piggyback:1150] Out: 7341 [Urine:4940; Emesis/NG output:140; Blood:500; Chest Tube:460] Intake/Output this shift: No intake/output data recorded.  Lab Results:  CBC: Recent Labs    03/01/18 2020 03/01/18 2056 03/02/18 0330  WBC 10.1  --  10.8*  HGB 10.0* 9.9* 9.3*  HCT 30.9* 29.0* 28.4*  PLT 102*  --  94*    BMET:  Recent Labs    02/27/18 1158  03/01/18 2056 03/02/18 0330  NA 137   < > 144 138  K 4.4   < > 3.9 3.8  CL 106  --  108 107  CO2 20*  --   --  23  GLUCOSE 90   < > 124* 112*  BUN 19  --  11 9  CREATININE 1.02*   < > 0.60 0.74  CALCIUM 9.7  --   --  7.3*   < > = values in this interval not displayed.     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

## 2018-03-02 NOTE — Plan of Care (Signed)
  Problem: Education: Goal: Knowledge of General Education information will improve Outcome: Progressing   Problem: Health Behavior/Discharge Planning: Goal: Ability to manage health-related needs will improve Outcome: Progressing   Problem: Clinical Measurements: Goal: Ability to maintain clinical measurements within normal limits will improve Outcome: Progressing Goal: Will remain free from infection Outcome: Progressing Goal: Diagnostic test results will improve Outcome: Progressing Goal: Respiratory complications will improve Outcome: Progressing Goal: Cardiovascular complication will be avoided Outcome: Progressing   Problem: Activity: Goal: Risk for activity intolerance will decrease Outcome: Progressing   Problem: Nutrition: Goal: Adequate nutrition will be maintained Outcome: Progressing   Problem: Coping: Goal: Level of anxiety will decrease Outcome: Progressing   Problem: Elimination: Goal: Will not experience complications related to bowel motility Outcome: Progressing Goal: Will not experience complications related to urinary retention Outcome: Progressing   Problem: Pain Managment: Goal: General experience of comfort will improve Outcome: Progressing   Problem: Safety: Goal: Ability to remain free from injury will improve Outcome: Progressing   Problem: Skin Integrity: Goal: Risk for impaired skin integrity will decrease Outcome: Progressing   Problem: Education: Goal: Ability to demonstrate proper wound care will improve Outcome: Progressing Goal: Knowledge of disease or condition will improve Outcome: Progressing Goal: Knowledge of the prescribed therapeutic regimen will improve Outcome: Progressing   Problem: Activity: Goal: Risk for activity intolerance will decrease Outcome: Progressing   Problem: Cardiac: Goal: Hemodynamic stability will improve Outcome: Progressing   Problem: Clinical Measurements: Goal: Postoperative  complications will be avoided or minimized Outcome: Progressing   Problem: Respiratory: Goal: Respiratory status will improve Outcome: Progressing   Problem: Skin Integrity: Goal: Wound healing without signs and symptoms of infection Outcome: Progressing Goal: Risk for impaired skin integrity will decrease Outcome: Progressing   Problem: Urinary Elimination: Goal: Ability to achieve and maintain adequate renal perfusion and functioning will improve Outcome: Progressing   

## 2018-03-02 NOTE — Progress Notes (Signed)
Pt places self on and off CPAP with help of RN and family members. Educated to call if need anything. RT to cont to monitor as needed.

## 2018-03-02 NOTE — Progress Notes (Signed)
CT surgery p.m. Rounds  Atrially paced at 70 bpm Biggest problem today is nausea, Reglan ordered and IV Phenergan ordered in addition to Zofran  otherwise Progressing well

## 2018-03-03 ENCOUNTER — Inpatient Hospital Stay (HOSPITAL_COMMUNITY): Payer: Medicare Other

## 2018-03-03 LAB — BASIC METABOLIC PANEL
ANION GAP: 6 (ref 5–15)
BUN: 11 mg/dL (ref 6–20)
CALCIUM: 8 mg/dL — AB (ref 8.9–10.3)
CO2: 29 mmol/L (ref 22–32)
Chloride: 99 mmol/L — ABNORMAL LOW (ref 101–111)
Creatinine, Ser: 0.73 mg/dL (ref 0.44–1.00)
GFR calc Af Amer: >60 mL/min (ref >60)
GLUCOSE: 97 mg/dL (ref 65–99)
POTASSIUM: 3.8 mmol/L (ref 3.5–5.1)
SODIUM: 134 mmol/L — AB (ref 135–145)

## 2018-03-03 LAB — PROTIME-INR
INR: 1.25
Prothrombin Time: 15.6 seconds — ABNORMAL HIGH (ref 11.4–15.2)

## 2018-03-03 LAB — MAGNESIUM: MAGNESIUM: 1.8 mg/dL (ref 1.7–2.4)

## 2018-03-03 LAB — CBC
HCT: 28.3 % — ABNORMAL LOW (ref 36.0–46.0)
Hemoglobin: 9.3 g/dL — ABNORMAL LOW (ref 12.0–15.0)
MCH: 33.5 pg (ref 26.0–34.0)
MCHC: 32.9 g/dL (ref 30.0–36.0)
MCV: 101.8 fL — ABNORMAL HIGH (ref 78.0–100.0)
Platelets: 90 10*3/uL — ABNORMAL LOW (ref 150–400)
RBC: 2.78 MIL/uL — ABNORMAL LOW (ref 3.87–5.11)
RDW: 12.7 % (ref 11.5–15.5)
WBC: 11.6 10*3/uL — AB (ref 4.0–10.5)

## 2018-03-03 LAB — TSH: TSH: 1.252 u[IU]/mL (ref 0.350–4.500)

## 2018-03-03 LAB — GLUCOSE, CAPILLARY
GLUCOSE-CAPILLARY: 100 mg/dL — AB (ref 65–99)
GLUCOSE-CAPILLARY: 97 mg/dL (ref 65–99)
GLUCOSE-CAPILLARY: 91 mg/dL (ref 65–99)

## 2018-03-03 MED ORDER — MOVING RIGHT ALONG BOOK
Freq: Once | Status: AC
  Administered 2018-03-03: 08:00:00
  Filled 2018-03-03: qty 1

## 2018-03-03 MED ORDER — POTASSIUM CHLORIDE 10 MEQ/50ML IV SOLN
10.0000 meq | INTRAVENOUS | Status: AC
  Administered 2018-03-03 (×3): 10 meq via INTRAVENOUS
  Filled 2018-03-03: qty 50

## 2018-03-03 MED ORDER — SODIUM CHLORIDE 0.9% FLUSH
3.0000 mL | Freq: Two times a day (BID) | INTRAVENOUS | Status: DC
  Administered 2018-03-03 – 2018-03-06 (×6): 3 mL via INTRAVENOUS

## 2018-03-03 MED ORDER — SODIUM CHLORIDE 0.9 % IV SOLN: 250.0000 mL | INTRAVENOUS | Status: DC | PRN

## 2018-03-03 MED ORDER — FUROSEMIDE 10 MG/ML IJ SOLN
20.0000 mg | Freq: Two times a day (BID) | INTRAMUSCULAR | Status: AC
  Administered 2018-03-03 (×2): 20 mg via INTRAVENOUS
  Filled 2018-03-03 (×2): qty 2

## 2018-03-03 MED ORDER — AMIODARONE HCL 200 MG PO TABS
400.0000 mg | ORAL_TABLET | Freq: Two times a day (BID) | ORAL | Status: DC
  Administered 2018-03-03 – 2018-03-04 (×3): 400 mg via ORAL
  Filled 2018-03-03 (×3): qty 2

## 2018-03-03 MED ORDER — ASPIRIN EC 81 MG PO TBEC
81.0000 mg | DELAYED_RELEASE_TABLET | Freq: Every day | ORAL | Status: DC
  Administered 2018-03-03 – 2018-03-07 (×4): 81 mg via ORAL
  Filled 2018-03-03 (×5): qty 1

## 2018-03-03 MED ORDER — POTASSIUM CHLORIDE CRYS ER 20 MEQ PO TBCR
20.0000 meq | EXTENDED_RELEASE_TABLET | Freq: Every day | ORAL | Status: AC
  Administered 2018-03-04 – 2018-03-07 (×4): 20 meq via ORAL
  Filled 2018-03-03 (×4): qty 1

## 2018-03-03 MED ORDER — FUROSEMIDE 40 MG PO TABS
40.0000 mg | ORAL_TABLET | Freq: Every day | ORAL | Status: AC
  Administered 2018-03-04 – 2018-03-07 (×4): 40 mg via ORAL
  Filled 2018-03-03 (×4): qty 1

## 2018-03-03 MED ORDER — FUROSEMIDE 40 MG PO TABS: 40.0000 mg | ORAL_TABLET | Freq: Every day | ORAL | Status: DC

## 2018-03-03 MED ORDER — SODIUM CHLORIDE 0.9% FLUSH: 3.0000 mL | INTRAVENOUS | Status: DC | PRN

## 2018-03-03 NOTE — Progress Notes (Signed)
      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  Nausea has improved/resolved.  Minimal pain.  No SOB  Objective: Vital signs: BP Readings from Last 1 Encounters:  03/03/18 (!) 121/58   Pulse Readings from Last 1 Encounters:  03/03/18 62   Resp Readings from Last 1 Encounters:  03/03/18 (!) 21   Temp Readings from Last 1 Encounters:  03/03/18 99.1 F (37.3 C) (Oral)    Hemodynamics: PAP: (34-38)/(12-14) 34/12  Physical Exam:  Rhythm:   sinus  Breath sounds: clear  Heart sounds:  RRR w/out murmur  Incisions:  Dressings dry, intact  Abdomen:  Soft, non-distended, non-tender  Extremities:  Warm, well-perfused  Chest tubes:  decreasing volume thin serosanguinous output, no air leak    Intake/Output from previous day: 05/16 0701 - 05/17 0700 In: 1877.7 [P.O.:1080; I.V.:547.7; IV Piggyback:250] Out: 2930 [Urine:2640; Chest Tube:290] Intake/Output this shift: Total I/O In: 50 [IV Piggyback:50] Out: 130 [Urine:110; Chest Tube:20]  Lab Results:  CBC: Recent Labs    03/02/18 1722 03/02/18 1723 03/03/18 0500  WBC 12.8*  --  11.6*  HGB 9.7* 9.5* 9.3*  HCT 30.2* 28.0* 28.3*  PLT 106*  --  90*    BMET:  Recent Labs    03/02/18 0330  03/02/18 1723 03/03/18 0500  NA 138  --  134* 134*  K 3.8  --  3.8 3.8  CL 107  --  95* 99*  CO2 23  --   --  29  GLUCOSE 112*  --  148* 97  BUN 9  --  10 11  CREATININE 0.74   < > 0.70 0.73  CALCIUM 7.3*  --   --  8.0*   < > = values in this interval not displayed.     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) TRANSESOPHAGEAL ECHOCARDIOGRAM (TEE) (N/A)  Doing well POD2 Maintaining NSR w/ stable BP, HR improved Breathing comfortably w/ O2 sats 97% on RA Post op nausea/vomiting, improved/resolved Acute on chronic diastolic CHF with expected post op volume excess, I/O negative 1 liter yesterday and weight down 1 lb but reportedly still 9 lbs > preop baseline Expected post op acute blood loss anemia, mild, stable Expected post op atelectasis, very mild Post op thrombocytopenia, platelet count 90k OSA on CPAP at home for sleep   Mobilize  Diuresis  Coumadin  Low dose ASA until therapeutic on Coumadin D/C tubes later today or tomorrow, depending on output Transfer 4E stepdown   [name], MD 03/03/2018 8:45 AM

## 2018-03-03 NOTE — Progress Notes (Signed)
Pt noted to be in A fib around 12:30, rate 80s-90s, confirmed by EKG; all other VS stable, pt asymptomatic. Junie Panning, [place] made aware. Received orders for PO Amio to start now, monitor in ICU for 2-3 hours and then proceed with transfer to 4E as long as HR remains controlled. Pt and spouse aware, verbalize understanding.

## 2018-03-03 NOTE — Discharge Summary (Addendum)
Physician Discharge Summary  Patient ID: Tammy Montoya MRN: [id number] DOB/AGE: 03/02/1951 67 y.o.  Admit date: 03/01/2018 Discharge date: 03/07/2018  Admission Diagnoses:  Patient Active Problem List   Diagnosis Date Noted  . MVP (mitral valve prolapse)   . Non-rheumatic mitral regurgitation   . Acquired hypothyroidism 10/30/2017  . Excessive daytime sleepiness 10/30/2017  . Fibroadenosis, breast diffuse 10/30/2017  . Generalized osteoarthritis of multiple sites 10/30/2017  . OSA (obstructive sleep apnea) 10/30/2017  . Perennial allergic rhinitis with seasonal variation 10/30/2017  . Cough 07/28/2016  . Right-sided low back pain with right-sided sciatica 09/30/2014  . Pernicious anemia    Discharge Diagnoses:   Patient Active Problem List   Diagnosis Date Noted  . S/P minimally invasive mitral valve repair 03/01/2018  . MVP (mitral valve prolapse)   . Non-rheumatic mitral regurgitation   . Acquired hypothyroidism 10/30/2017  . Excessive daytime sleepiness 10/30/2017  . Fibroadenosis, breast diffuse 10/30/2017  . Generalized osteoarthritis of multiple sites 10/30/2017  . OSA (obstructive sleep apnea) 10/30/2017  . Perennial allergic rhinitis with seasonal variation 10/30/2017  . Cough 07/28/2016  . Right-sided low back pain with right-sided sciatica 09/30/2014  . Pernicious anemia    Discharged Condition: good  History of Present Illness:  Tammy Montoya is a 68 yo female with history of mitral valve prolapse, rheumatic fever during childhood, frequent PVCs and palpitations, obstructive sleep apnea,and hypothyroidism who has been referred for surgical consultation to discuss treatment options for management of mitral valve prolapse with severe primary mitral regurgitation.  The patient states that she had rheumatic fever during childhood. She was diagnosed with mitral valve prolapse more than 30 years ago. She has otherwise remained healthy and fairly active physically  until several months ago when she began to experience palpitations and fatigue.She underwent a sleep study and was diagnosed with obstructive sleep apnea. She was referred for cardiology consultation and evaluated by Dr. Stanford Breed in early December. Transthoracic echocardiogram performed October 21, 2017 revealed normal left ventricular size and systolic function with mitral valve prolapse and moderate to severe mitral regurgitation. There was moderate to severe left atrial enlargement and findings consistent with mild pulmonary hypertension. The patient subsequently underwent transesophageal echocardiogram on December 09, 2017. This confirmed the presence of mitral valve prolapse with severe prolapse involving the middle scallop of the posterior leaflet. There was severe mitral regurgitation. There was normal left ventricular systolic function. There was moderate to severe left atrial enlargement. She was evaluated by Dr. Roxy Manns who was in agreement the patient would benefit from surgical intervention.  The risks and benefits of the procedure were explained to the patient and he was agreeable to proceed.  He continues to have some intermittent paroxysms but is primarily in sinus rhythm.  Hospital Course:   Tammy Montoya presented to [facility] on 03/01/2018.  She was taken to the operating room and underwent Minimally Invasive Mitral Valve Repair.  She tolerated the procedure without difficulty and was taken to the SICU in stable condition.  She was extubated the evening of surgery.  During her stay in the SICU the patient required AAI pacing.  She was started on diuretics for post operative hypervolemia.  The patient was started on coumadin slowly for stroke prophylaxis for her mitral valve repair.  She developed rate controlled Atrial Fibrillation.  She was started on oral Amiodarone for this.  She continues to have some intermittent paroxysms but is primarily in sinus rhythm.  Her chest tubes and  arterial lines  were removed without difficulty.  She was felt medically stable for transfer to the telemetry unit on 03/05/2018.  She has had some postoperative nausea is not fact felt to be at least somewhat related to medications including amiodarone.  It has shown some improvement with time and in particular when she takes her medications with food.  She is tolerating gradually increasing activities using cardiac rehab modalities.  Incisions healing well without evidence of infection.  She is tolerating diet.  She has had a bowel movement.  She has been weaned from oxygen and maintains good saturations on room air.INR has been monitored daily.  Most recent value is 1.06.  She will be discharged on 5 mg of Coumadin.  She is not felt to need to be completely anticoagulated prior to discharge.  At time of discharge the patient is felt to be quite stable.  Significant Diagnostic Studies: cardiac graphics:   Echocardiogram:  Study Conclusions  - Left ventricle: Systolic function was normal. The estimated   ejection fraction was in the range of 55% to 60%. Wall motion was   normal; there were no regional wall motion abnormalities. - Aortic valve: No evidence of vegetation. - Mitral valve: Thickening. Severe prolapse, involving the middle   scallop of the posterior leaflet. There was severe regurgitation   directed eccentrically. - Left atrium: The atrium was moderately to severely dilated. No   evidence of thrombus in the atrial cavity or appendage. - Right atrium: No evidence of thrombus in the atrial cavity or   appendage. - Atrial septum: No defect or patent foramen ovale was identified. - Tricuspid valve: No evidence of vegetation. - Pulmonic valve: No evidence of vegetation.  Impressions:  - Normal LV function; severe prolapse of posterior MV leaflet (P2);   severe eccentric MR; moderate to severe LAE; mild TR.  Treatments: surgery:    Minimally-Invasive Mitral Valve Repair              Complex valvuloplasty including artificial Gore-tex neochord placement x12             Sorin Memo 4D Ring Annuloplasty (size 29mm, catalog # [id number], serial # [id number])  Discharge Exam: Blood pressure 134/62, pulse 73, temperature 98.4 F (36.9 C), temperature source Oral, resp. rate 17, height 5\' 9"  (1.753 m), weight 69.2 kg (152 lb 9.6 oz), last menstrual period 09/17/2012, SpO2 98 %.  General appearance: alert, cooperative and no distress Heart: regular rate and rhythm Lungs: clear to auscultation bilaterally Abdomen: soft, non tender Extremities: no edema Wound: incis healing well    Disposition: Discharge disposition: 01-Home or Self Care      Home  Discharge medications: Discharge Instructions    Amb Referral to Cardiac Rehabilitation   Complete by:  As directed    [facility]   Diagnosis:  Valve Repair   Valve:  Mitral   Discharge patient   Complete by:  As directed    Discharge disposition:  01-Home or Self Care   Discharge patient date:  03/06/2018   Discharge patient   Complete by:  As directed    Discharge disposition:  01-Home or Self Care   Discharge patient date:  03/07/2018     Allergies as of 03/07/2018      Reactions   Gluten Meal Other (See Comments)   Stomach pain   Protonix [pantoprazole] Itching      Medication List    STOP taking these medications   furosemide 20 MG tablet Commonly known as:  LASIX   meloxicam 15 MG tablet Commonly known as:  MOBIC   nebivolol 2.5 MG tablet Commonly known as:  BYSTOLIC   traMADol-acetaminophen 37.5-325 MG tablet Commonly known as:  ULTRACET     TAKE these medications   amiodarone 200 MG tablet Commonly known as:  PACERONE Take 1 tablet (200 mg total) by mouth 2 (two) times daily.   aspirin 81 MG EC tablet Take 1 tablet (81 mg total) by mouth daily.   azelastine 0.1 % nasal spray Commonly known as:  ASTELIN Place 1 spray into both nostrils daily as needed for allergies.    cetirizine 10 MG tablet Commonly known as:  ZYRTEC Take 10 mg by mouth daily.   cyanocobalamin 1000 MCG/ML injection Commonly known as:  (VITAMIN B-12) Inject 1,000 mcg into the muscle 3 (three) times a week.   esomeprazole 40 MG capsule Commonly known as:  NEXIUM Take 40 mg by mouth as needed.   estazolam 2 MG tablet Commonly known as:  PROSOM Take 1-2 mg by mouth at bedtime as needed (for sleep).   estradiol 0.05 mg/24hr patch Commonly known as:  CLIMARA - Dosed in mg/24 hr Place 1 patch (0.05 mg total) onto the skin once a week. What changed:  when to take this   ketotifen 0.025 % ophthalmic solution Commonly known as:  ZADITOR Place 1 drop into both eyes 2 (two) times daily as needed (for dry eyes).   levothyroxine 88 MCG tablet Commonly known as:  SYNTHROID, LEVOTHROID Take 88 mcg by mouth daily before breakfast.   liothyronine 5 MCG tablet Commonly known as:  CYTOMEL Take 15 mcg by mouth daily.   MULTIVITAL PO Take 1 tablet by mouth daily.   ondansetron 4 MG tablet Commonly known as:  ZOFRAN Take 1 tablet (4 mg total) by mouth every 8 (eight) hours as needed for nausea or vomiting.   ondansetron 4 MG tablet Commonly known as:  ZOFRAN Take 1 tablet (4 mg total) by mouth every 8 (eight) hours as needed for nausea or vomiting.   progesterone 200 MG capsule Commonly known as:  PROMETRIUM Take 1 capsule (200 mg total) by mouth daily.   sodium chloride 0.65 % Soln nasal spray Commonly known as:  OCEAN Place 1-2 sprays into both nostrils as needed for congestion.   traMADol 50 MG tablet Commonly known as:  ULTRAM Take 1-2 tablets (50-100 mg total) by mouth every 6 (six) hours as needed for moderate pain.   Vitamin D 2000 units Caps Take 2,000 Units by mouth daily.   warfarin 5 MG tablet Commonly known as:  COUMADIN Take 1 tablet (5 mg total) by mouth daily at 6 PM. As directed by the coumadin clinic      Follow-up Information    [facility] Follow up on 03/20/2018.   Specialty:  Cardiothoracic Surgery Why:  Appointment is at 3:00 Contact information: [place], [place] Office Follow up on 03/08/2018.   Specialty:  Cardiology Why:  Appointment is at  Contact information: [address], [place] [phone]       Lelon Perla, MD Follow up on 03/16/2018.   Specialty:  Cardiology Why:  at 11:30 AM with his Nurse Practitioner Jory Sims Contact information: [address] [place] [address] [phone]  Signed: John Giovanni 03/07/2018, 9:34 AM

## 2018-03-03 NOTE — Plan of Care (Signed)
  Problem: Education: Goal: Knowledge of General Education information will improve Outcome: Progressing   Problem: Health Behavior/Discharge Planning: Goal: Ability to manage health-related needs will improve Outcome: Progressing   Problem: Clinical Measurements: Goal: Ability to maintain clinical measurements within normal limits will improve Outcome: Progressing Goal: Will remain free from infection Outcome: Progressing Goal: Diagnostic test results will improve Outcome: Progressing Goal: Respiratory complications will improve Outcome: Progressing Goal: Cardiovascular complication will be avoided Outcome: Progressing   Problem: Activity: Goal: Risk for activity intolerance will decrease Outcome: Progressing   Problem: Nutrition: Goal: Adequate nutrition will be maintained Outcome: Progressing   Problem: Coping: Goal: Level of anxiety will decrease Outcome: Progressing   Problem: Elimination: Goal: Will not experience complications related to bowel motility Outcome: Progressing Goal: Will not experience complications related to urinary retention Outcome: Progressing   Problem: Pain Managment: Goal: General experience of comfort will improve Outcome: Progressing   Problem: Safety: Goal: Ability to remain free from injury will improve Outcome: Progressing   Problem: Skin Integrity: Goal: Risk for impaired skin integrity will decrease Outcome: Progressing   Problem: Education: Goal: Ability to demonstrate proper wound care will improve Outcome: Progressing Goal: Knowledge of disease or condition will improve Outcome: Progressing Goal: Knowledge of the prescribed therapeutic regimen will improve Outcome: Progressing   Problem: Activity: Goal: Risk for activity intolerance will decrease Outcome: Progressing   Problem: Cardiac: Goal: Hemodynamic stability will improve Outcome: Progressing   Problem: Clinical Measurements: Goal: Postoperative  complications will be avoided or minimized Outcome: Progressing   Problem: Respiratory: Goal: Respiratory status will improve Outcome: Progressing   Problem: Skin Integrity: Goal: Wound healing without signs and symptoms of infection Outcome: Progressing Goal: Risk for impaired skin integrity will decrease Outcome: Progressing   Problem: Urinary Elimination: Goal: Ability to achieve and maintain adequate renal perfusion and functioning will improve Outcome: Progressing   

## 2018-03-03 NOTE — Progress Notes (Signed)
Paged Erin PA to confirm that patient can transfer to 4E, HR has stayed controlled in 80s-90s, maintains afib, VS remain stable. PA confirmed that pt can continue with transfer. Family verbalizes understanding. Report called to [facility].

## 2018-03-03 NOTE — Progress Notes (Signed)
Patient developed rate controlled Atrial Fibrillation this afternoon.  She is stable without symptoms   Plan:  1. Start oral Amiodarone 400 mg BID... She is on coumadin for her Mitral Valve repair 2. Check Mg, TSH level... Received Potassium runs this morning for low K 3. Get EKG    Erin Barrett PA-C

## 2018-03-04 ENCOUNTER — Inpatient Hospital Stay (HOSPITAL_COMMUNITY): Payer: Medicare Other

## 2018-03-04 LAB — CBC
HEMATOCRIT: 27.7 % — AB (ref 36.0–46.0)
HEMOGLOBIN: 9 g/dL — AB (ref 12.0–15.0)
MCH: 33 pg (ref 26.0–34.0)
MCHC: 32.5 g/dL (ref 30.0–36.0)
MCV: 101.5 fL — ABNORMAL HIGH (ref 78.0–100.0)
Platelets: 102 10*3/uL — ABNORMAL LOW (ref 150–400)
RBC: 2.73 MIL/uL — ABNORMAL LOW (ref 3.87–5.11)
RDW: 12.7 % (ref 11.5–15.5)
WBC: 11.2 10*3/uL — AB (ref 4.0–10.5)

## 2018-03-04 LAB — BASIC METABOLIC PANEL
ANION GAP: 8 (ref 5–15)
BUN: 12 mg/dL (ref 6–20)
CO2: 29 mmol/L (ref 22–32)
Calcium: 8.3 mg/dL — ABNORMAL LOW (ref 8.9–10.3)
Chloride: 97 mmol/L — ABNORMAL LOW (ref 101–111)
Creatinine, Ser: 0.73 mg/dL (ref 0.44–1.00)
GFR calc non Af Amer: >60 mL/min (ref >60)
Glucose, Bld: 93 mg/dL (ref 65–99)
Potassium: 4 mmol/L (ref 3.5–5.1)
Sodium: 134 mmol/L — ABNORMAL LOW (ref 135–145)

## 2018-03-04 LAB — PROTIME-INR
INR: 1.12
Prothrombin Time: 14.3 seconds (ref 11.4–15.2)

## 2018-03-04 MED ORDER — AMIODARONE HCL 200 MG PO TABS
200.0000 mg | ORAL_TABLET | Freq: Two times a day (BID) | ORAL | Status: DC
  Administered 2018-03-05 – 2018-03-07 (×5): 200 mg via ORAL
  Filled 2018-03-04 (×5): qty 1

## 2018-03-04 NOTE — Progress Notes (Signed)
CARDIAC REHAB PHASE I   Pt is sitting in recliner with husband at side. Pt recently walked with husband upon cardiac rehab arrival. Pt was able to walk without signs/symptoms of unusual SOB, dizziness or discomfort. Pt has already walked 3x today. Did educate patient and spouse on sternal precautions, activity/lifting restrictions, exercise guidelines, emergency action and temperature precautions. Gave pt handout for [place] and information regarding cardiac rehab at [facility]. Pt is very sleepy and unable to keep eyes open with education. Spouse was given handouts and voiced understanding of education material. Encourage mobility, IS, rest. Pt left with call bell, telephone, and handouts in reach. Will f/u Monday. [phone]  Tammy Sawdey [name],MS,ACSM-RCEP 03/04/2018 2:25 PM

## 2018-03-04 NOTE — Progress Notes (Addendum)
[address]       [place] [address]             [phone]      3 Days Post-Op Procedure(s) (LRB): MINIMALLY INVASIVE MITRAL VALVE REPAIR (MVR) (Right) TRANSESOPHAGEAL ECHOCARDIOGRAM (TEE) (N/A) Subjective: Patient is sleepy this morning. She plans to walk several times today and is using her incentive spirometer.   Objective: Vital signs in last 24 hours: Temp:  [98.3 F (36.8 C)-98.7 F (37.1 C)] 98.4 F (36.9 C) (05/18 0315) Pulse Rate:  [61-118] 64 (05/18 0315) Cardiac Rhythm: Sinus bradycardia;Heart block (05/18 0700) Resp:  [12-21] 14 (05/18 0315) BP: (106-142)/(53-69) 131/64 (05/18 0315) SpO2:  [91 %-99 %] 96 % (05/18 0315) Weight:  [162 lb 11.2 oz (73.8 kg)] 162 lb 11.2 oz (73.8 kg) (05/18 0315)     Intake/Output from previous day: 05/17 0701 - 05/18 0700 In: 400 [P.O.:250; IV Piggyback:150] Out: 4742 [Urine:8760; Chest Tube:290] Intake/Output this shift: No intake/output data recorded.  General appearance: alert, cooperative and no distress Heart: sinus brady Lungs: clear to auscultation bilaterally Abdomen: soft, non-tender; bowel sounds normal; no masses,  no organomegaly Extremities: 1-2 non-piting pedal edema in the lower extremity Wound: clean and dry  Lab Results: Recent Labs    03/03/18 0500 03/04/18 0355  WBC 11.6* 11.2*  HGB 9.3* 9.0*  HCT 28.3* 27.7*  PLT 90* 102*   BMET:  Recent Labs    03/03/18 0500 03/04/18 0355  NA 134* 134*  K 3.8 4.0  CL 99* 97*  CO2 29 29  GLUCOSE 97 93  BUN 11 12  CREATININE 0.73 0.73  CALCIUM 8.0* 8.3*    PT/INR:  Recent Labs    03/04/18 0355  LABPROT 14.3  INR 1.12   ABG    Component Value Date/Time   PHART 7.344 (L) 03/01/2018 2142   HCO3 24.1 03/01/2018 2142   TCO2 27 03/02/2018 1723   ACIDBASEDEF 2.0 03/01/2018 2142   O2SAT 100.0 03/01/2018 2142   CBG (last 3)  Recent Labs    03/03/18 0343 03/03/18 0727 03/03/18 1147  GLUCAP 100* 97 91     Assessment/Plan: S/P Procedure(s) (LRB): MINIMALLY INVASIVE MITRAL VALVE REPAIR (MVR) (Right) TRANSESOPHAGEAL ECHOCARDIOGRAM (TEE) (N/A)  1. CV- went into atrial fibrillation yesterday afternoon. Amio 400mg  BID. Anticoagulated with coumadin. INR is 1.12. Now in SB wit 1st degree AVB. BP stable. Not on a BB.  2. Pulm-tolerating room air with good oxygen saturation. Chest tubes still putting out 290cc/24 hours. Will discuss removal. 3. Renal- creatinine 0.73, electrolytes okay. Continue oral diuretics.  4. H and H is stable, expected acute blood loss anemia. Platelets trending up 5. Endo-blood glucose level is well controlled.  6. Pain well controlled on Tramadol and Tylenol alone  Plan: Keep EPW one more day until rhythm stabilizes. Will brady down to 30s at night, but on CPAP. Continue chest tubes hopefully one more day. Drainage is still too high to remove. Encouraged ambulation in the halls. Continue diuretics for fluid overload.    LOS: 3 days    Elgie Collard 03/04/2018  Some a fib yesterday, sinus brady today 290 from chest tube, leave one more day On coumadin  Lab Results  Component Value Date   INR 1.12 03/04/2018   INR 1.25 03/03/2018   INR 1.41 03/01/2018    I have seen and examined Tammy Montoya and agree with the above assessment  and plan.  Grace Isaac MD Beeper  [phone] Office [phone] 03/04/2018 11:05 AM

## 2018-03-05 ENCOUNTER — Inpatient Hospital Stay (HOSPITAL_COMMUNITY): Payer: Medicare Other

## 2018-03-05 LAB — PROTIME-INR
INR: 1.04
PROTHROMBIN TIME: 13.5 s (ref 11.4–15.2)

## 2018-03-05 NOTE — Progress Notes (Signed)
Patient has been going in and out of At. Fib and S.R. Tonight with rate control. Cont. To monitor patient and rhythm.

## 2018-03-05 NOTE — Progress Notes (Signed)
dc'ed chest tube pt. tolerated well  

## 2018-03-05 NOTE — Progress Notes (Signed)
dc'ed pacing wires pt tolarated well  

## 2018-03-05 NOTE — Progress Notes (Addendum)
Tammy Montoya [address]       Tammy Montoya,Tammy Montoya [address]             [phone]      4 Days Post-Op Procedure(s) (LRB): MINIMALLY INVASIVE MITRAL VALVE REPAIR (MVR) (Right) TRANSESOPHAGEAL ECHOCARDIOGRAM (TEE) (N/A) Subjective: She has had some nausea and constipation.   Objective: Vital signs in last 24 hours: Temp:  [98.2 F (36.8 C)-98.7 F (37.1 C)] 98.4 F (36.9 C) (05/19 0826) Pulse Rate:  [57-103] 64 (05/19 0826) Cardiac Rhythm: Normal sinus rhythm;Heart block (05/19 0700) Resp:  [13-20] 17 (05/19 0826) BP: (134-145)/(54-86) 143/66 (05/19 0826) SpO2:  [97 %-100 %] 97 % (05/19 0826) Weight:  [157 lb 4.8 oz (71.4 kg)] 157 lb 4.8 oz (71.4 kg) (05/19 0500)     Intake/Output from previous day: 05/18 0701 - 05/19 0700 In: 940 [P.O.:940] Out: 2315 [Urine:2200; Chest Tube:115] Intake/Output this shift: Total I/O In: 120 [P.O.:120] Out: 0   General appearance: alert, cooperative and no distress Heart: regular rate and rhythm, S1, S2 normal, no murmur, click, rub or gallop Lungs: clear to auscultation bilaterally Abdomen: soft, non-tender; bowel sounds normal; no masses,  no organomegaly Extremities: extremities normal, atraumatic, no cyanosis or edema Wound: clean and dry  Lab Results: Recent Labs    03/03/18 0500 03/04/18 0355  WBC 11.6* 11.2*  HGB 9.3* 9.0*  HCT 28.3* 27.7*  PLT 90* 102*   BMET:  Recent Labs    03/03/18 0500 03/04/18 0355  NA 134* 134*  K 3.8 4.0  CL 99* 97*  CO2 29 29  GLUCOSE 97 93  BUN 11 12  CREATININE 0.73 0.73  CALCIUM 8.0* 8.3*    PT/INR:  Recent Labs    03/05/18 0417  LABPROT 13.5  INR 1.04   ABG    Component Value Date/Time   PHART 7.344 (L) 03/01/2018 2142   HCO3 24.1 03/01/2018 2142   TCO2 27 03/02/2018 1723   ACIDBASEDEF 2.0 03/01/2018 2142   O2SAT 100.0 03/01/2018 2142   CBG (last 3)  Recent Labs    03/03/18 0343 03/03/18 0727 03/03/18 1147  GLUCAP 100* 97 91    Assessment/Plan: S/P  Procedure(s) (LRB): MINIMALLY INVASIVE MITRAL VALVE REPAIR (MVR) (Right) TRANSESOPHAGEAL ECHOCARDIOGRAM (TEE) (N/A)  1. CV- Still has episodes of afib/flutter on tele. Amio 200mg  BID. Anticoagulated with coumadin. INR is 1.04. Now NSR in the 70s. BP stable. Not on a BB.  2. Pulm-tolerating room air with good oxygen saturation. Discontinue chest tubes. Drainage was 125ml/24 hours. CXR is stable 3. Renal- creatinine 0.73, electrolytes okay. Continue oral diuretics.  4. H and H is stable, expected acute blood loss anemia. Platelets trending up 5. Endo-blood glucose level is well controlled.  6. Pain well controlled on Tramadol and Tylenol alone 7. Nausea- On Reglan and phenergan   Plan: Discontinue chest tube. Rhythm remains unstable. Need to keep wires on more day. Possibly home tomorrow vs. Tuesday. Continue coumadin, may need to increase the dose since her INR went down.    LOS: 4 days    Elgie Collard 03/05/2018  On coumadin  Lab Results  Component Value Date   INR 1.04 03/05/2018   INR 1.12 03/04/2018   INR 1.25 03/03/2018   Now sinus 70"s , brief rate in 40 last pm, Cordarone  dose  Decreased to 200 bid D/c pacing wires this am, remove chest tube later today  Feels much better today  Poss home 1-2 days I have seen and  examined Tammy Montoya and agree with the above assessment  and plan.  Grace Isaac MD Beeper [phone] Office [phone] 03/05/2018 11:26 AM

## 2018-03-06 ENCOUNTER — Inpatient Hospital Stay (HOSPITAL_COMMUNITY): Payer: Medicare Other

## 2018-03-06 ENCOUNTER — Other Ambulatory Visit: Payer: Self-pay

## 2018-03-06 DIAGNOSIS — I481 Persistent atrial fibrillation: Secondary | ICD-10-CM

## 2018-03-06 LAB — PROTIME-INR
INR: 1.06
Prothrombin Time: 13.7 seconds (ref 11.4–15.2)

## 2018-03-06 MED ORDER — WARFARIN SODIUM 5 MG PO TABS
5.0000 mg | ORAL_TABLET | Freq: Every day | ORAL | Status: DC
  Administered 2018-03-06: 5 mg via ORAL
  Filled 2018-03-06: qty 1

## 2018-03-06 MED ORDER — AMIODARONE HCL 200 MG PO TABS: 200.0000 mg | ORAL_TABLET | Freq: Two times a day (BID) | ORAL | 1 refills | Status: DC

## 2018-03-06 MED ORDER — ONDANSETRON HCL 4 MG PO TABS: 4.0000 mg | ORAL_TABLET | Freq: Three times a day (TID) | ORAL | 0 refills | Status: DC | PRN

## 2018-03-06 MED ORDER — WARFARIN SODIUM 5 MG PO TABS: 5.0000 mg | ORAL_TABLET | Freq: Every day | ORAL | 1 refills | Status: DC

## 2018-03-06 MED ORDER — ASPIRIN 81 MG PO TBEC: 81.0000 mg | DELAYED_RELEASE_TABLET | Freq: Every day | ORAL | Status: AC

## 2018-03-06 MED ORDER — OXYCODONE HCL 5 MG PO TABS: 5.0000 mg | ORAL_TABLET | Freq: Four times a day (QID) | ORAL | 0 refills | Status: DC | PRN

## 2018-03-06 NOTE — Plan of Care (Signed)
  Problem: Health Behavior/Discharge Planning: Goal: Ability to manage health-related needs will improve Outcome: Progressing   Problem: Clinical Measurements: Goal: Ability to maintain clinical measurements within normal limits will improve Outcome: Progressing Goal: Respiratory complications will improve Outcome: Progressing Goal: Cardiovascular complication will be avoided Outcome: Progressing   Problem: Activity: Goal: Risk for activity intolerance will decrease Outcome: Progressing   Problem: Pain Managment: Goal: General experience of comfort will improve Outcome: Progressing   Problem: Respiratory: Goal: Respiratory status will improve Outcome: Progressing   Problem: Education: Goal: Knowledge of General Education information will improve Outcome: Completed/Met   Problem: Elimination: Goal: Will not experience complications related to bowel motility Outcome: Completed/Met

## 2018-03-06 NOTE — Progress Notes (Signed)
CARDIAC REHAB PHASE I   10:40a-11:05am Patient is ambulating independently-just walked an hour ago. Reviewed education.   [place], [place] 03/06/2018 11:01 AM

## 2018-03-06 NOTE — Care Management Important Message (Signed)
Important Message  Patient Details  Name: Tammy Montoya MRN: [id number] Date of Birth: Jun 26, 1951   Medicare Important Message Given:  Yes    Tammy Montoya 03/06/2018, 3:45 PM

## 2018-03-06 NOTE — Progress Notes (Signed)
Progress Note  Patient Name: Tammy Montoya Date of Encounter: 03/06/2018  Primary Cardiologist: No primary care provider on file.   Subjective   Complains of nausea but states that Phenergan has worked very well.  No shortness of breath.  No chest pain except for expected soreness at the thoracotomy site.  Inpatient Medications    Scheduled Meds: . acetaminophen  1,000 mg Oral Q6H  . amiodarone  200 mg Oral BID  . aspirin EC  81 mg Oral Daily  . bisacodyl  10 mg Oral Daily   Or  . bisacodyl  10 mg Rectal Daily  . Chlorhexidine Gluconate Cloth  6 each Topical Daily  . docusate sodium  200 mg Oral Daily  . enoxaparin (LOVENOX) injection  40 mg Subcutaneous QHS  . furosemide  40 mg Oral Daily  . levothyroxine  88 mcg Oral QAC breakfast  . metoCLOPramide (REGLAN) injection  10 mg Intravenous Q6H  . potassium chloride  20 mEq Oral Daily  . sodium chloride flush  10-40 mL Intracatheter Q12H  . sodium chloride flush  3 mL Intravenous Q12H  . sodium chloride flush  3 mL Intravenous Q12H  . warfarin  2.5 mg Oral q1800  . Warfarin - Physician Dosing Inpatient   Does not apply q1800   Continuous Infusions: . sodium chloride    . sodium chloride     PRN Meds: sodium chloride, metoprolol tartrate, ondansetron (ZOFRAN) IV, oxyCODONE, promethazine, sodium chloride flush, sodium chloride flush, sodium chloride flush, traMADol   Vital Signs    Vitals:   03/05/18 1530 03/05/18 1958 03/05/18 2356 03/06/18 0439  BP: 136/88 125/63 132/63 132/70  Pulse: 79 75 60 76  Resp: 14 18 17 14   Temp:  99.4 F (37.4 C) 98.1 F (36.7 C) 98.4 F (36.9 C)  TempSrc:  Oral Oral Oral  SpO2: 98% 95% 96% 96%  Weight:    156 lb 8.4 oz (71 kg)  Height:        Intake/Output Summary (Last 24 hours) at 03/06/2018 0748 Last data filed at 03/05/2018 2300 Gross per 24 hour  Intake 840 ml  Output 800 ml  Net 40 ml   Filed Weights   03/04/18 0315 03/05/18 0500 03/06/18 0439  Weight: 162 lb 11.2 oz  (73.8 kg) 157 lb 4.8 oz (71.4 kg) 156 lb 8.4 oz (71 kg)    Telemetry    Intermittent episodes of atrial fibrillation, currently in sinus rhythm.- Personally Reviewed   Physical Exam  Alert, oriented woman in no distress GEN: No acute distress.   Neck: No JVD Cardiac: RRR, no murmurs, rubs, or gallops.  Respiratory: Clear to auscultation bilaterally. GI: Soft, nontender, non-distended  MS: No edema; No deformity. Neuro:  Nonfocal  Psych: Normal affect   Labs    Chemistry Recent Labs  Lab 02/27/18 1158  03/02/18 0330 03/02/18 1722 03/02/18 1723 03/03/18 0500 03/04/18 0355  NA 137   < > 138  --  134* 134* 134*  K 4.4   < > 3.8  --  3.8 3.8 4.0  CL 106   < > 107  --  95* 99* 97*  CO2 20*  --  23  --   --  29 29  GLUCOSE 90   < > 112*  --  148* 97 93  BUN 19   < > 9  --  10 11 12   CREATININE 1.02*   < > 0.74 0.82 0.70 0.73 0.73  CALCIUM 9.7  --  7.3*  --   --  8.0* 8.3*  PROT 7.1  --   --   --   --   --   --   ALBUMIN 4.3  --   --   --   --   --   --   AST 28  --   --   --   --   --   --   ALT 16  --   --   --   --   --   --   ALKPHOS 64  --   --   --   --   --   --   BILITOT 1.2  --   --   --   --   --   --   GFRNONAA 56*   < > >60 >60  --  >60 >60  GFRAA >60   < > >60 >60  --  >60 >60  ANIONGAP 11  --  8  --   --  6 8   < > = values in this interval not displayed.     Hematology Recent Labs  Lab 03/02/18 1722 03/02/18 1723 03/03/18 0500 03/04/18 0355  WBC 12.8*  --  11.6* 11.2*  RBC 2.97*  --  2.78* 2.73*  HGB 9.7* 9.5* 9.3* 9.0*  HCT 30.2* 28.0* 28.3* 27.7*  MCV 101.7*  --  101.8* 101.5*  MCH 32.7  --  33.5 33.0  MCHC 32.1  --  32.9 32.5  RDW 12.7  --  12.7 12.7  PLT 106*  --  90* 102*    Cardiac EnzymesNo results for input(s): TROPONINI in the last 168 hours. No results for input(s): TROPIPOC in the last 168 hours.   BNPNo results for input(s): BNP, PROBNP in the last 168 hours.   DDimer No results for input(s): DDIMER in the last 168 hours.    Radiology    Dg Chest Port 1 View  Result Date: 03/06/2018 CLINICAL DATA:  Chest tube removal EXAM: PORTABLE CHEST 1 VIEW COMPARISON:  Mar 05, 2018 FINDINGS: Right chest tube has been removed. Temporary pacemaker wires no longer appreciable. No evident pneumothorax. There is no appreciable edema or consolidation. Heart size and pulmonary vascular normal. Status post mitral valve replacement. No adenopathy. No bone lesions. IMPRESSION: Chest tube and pacemaker wires no longer present. No pneumothorax. No edema or consolidation. Stable cardiac silhouette. Electronically Signed   By: Lowella Grip III M.D.   On: 03/06/2018 07:29   Dg Chest Port 1 View  Result Date: 03/05/2018 CLINICAL DATA:  Patient with chest tube.  Follow-up EXAM: PORTABLE CHEST 1 VIEW COMPARISON:  Chest radiograph 03/04/2018. FINDINGS: Monitoring leads overlie the patient. Patient is rotated to the left. Stable cardiac and mediastinal contours. Right chest tube remains in place. Persistent small left pleural effusion and left lung base opacities. Right lung is clear. No definite pneumothorax. IMPRESSION: Right chest tube remains in place.  No definite pneumothorax. Small left pleural effusion and underlying opacities. Electronically Signed   By: Lovey Newcomer M.D.   On: 03/05/2018 10:19   Dg Chest Port 1 View  Result Date: 03/04/2018 CLINICAL DATA:  Shortness of breath with central chest pain. Some cough and congestion. EXAM: PORTABLE CHEST 1 VIEW COMPARISON:  03/03/2018 FINDINGS: Right-sided chest tube unchanged with tip over the apex. Lungs are well inflated without focal airspace consolidation or effusion. No pneumothorax. Cardiomediastinal silhouette and remainder of the exam is unchanged. IMPRESSION: No active disease. Right-sided chest  tube unchanged.  No pneumothorax. Electronically Signed   By: Marin Olp M.D.   On: 03/04/2018 09:53     Patient Profile     67 y.o. female with severe mitral regurgitation who  presented 5/15 for minimally invasive mitral valve repair  Assessment & Plan    1.  Severe mitral regurgitation: Now postoperative day #5 for minimally invasive mitral valve repair.  The patient appears to be making very good progress after mitral valve surgery.  She has no murmur on exam.  Appears to be euvolemic.  2.  Postoperative volume excess: Improved, no further edema on exam.  Lungs are clear.  3.  Expected postoperative blood loss anemia: Stable  4.  Postoperative atrial fibrillation: Patient on oral amiodarone 200 mg twice daily.  Anticoagulated with warfarin INR not yet trending upward.  Also on aspirin 81 mg.  Telemetry is reviewed and shows the patient still going in and out of atrial fibrillation.  However, even when she is in atrial fib her heart rates are reasonably controlled.  She has had no further bradycardic events.  She seems to be tolerating amiodarone at low dose with food as long as she is able to take oral Phenergan.  Will arrange outpatient cardiology follow-up within 2 weeks of discharge and would anticipate 4 to 6 weeks of amiodarone depending on her tolerance of this.  For questions or updates, please contact [place] Please consult [email or web address] for contact info under Cardiology/STEMI.      Signed, Sherren Mocha, MD  03/06/2018, 7:48 AM

## 2018-03-06 NOTE — Care Management Note (Signed)
Case Management Note Marvetta Gibbons RN,BSN [facility]-22 Case Manager  [phone]  Patient Details  Name: Tammy Montoya MRN: [id number] Date of Birth: 17-Mar-1951  Subjective/Objective:   Pt admitted s/p mini MVR                Action/Plan: PTA pt lived at home with spouse- anticipate return home- CM to follow for transition of care needs.   Expected Discharge Date:  03/06/18               Expected Discharge Plan:  Home/Self Care  In-House Referral:     Discharge planning Services  CM Consult  Post Acute Care Choice:    Choice offered to:     DME Arranged:    DME Agency:     HH Arranged:    HH Agency:     Status of Service:  Completed, signed off  If discussed at [employer] of Stay Meetings, dates discussed:    Discharge Disposition: home/self care   Additional Comments:  03/06/18- 1445- Marvetta Gibbons RN, CM- pt for d/c home today- no CM needs noted for transition home.   Dawayne Patricia, RN 03/06/2018, 2:48 PM

## 2018-03-06 NOTE — Progress Notes (Addendum)
[address]       [place] [address]             [phone]      5 Days Post-Op Procedure(s) (LRB): MINIMALLY INVASIVE MITRAL VALVE REPAIR (MVR) (Right) TRANSESOPHAGEAL ECHOCARDIOGRAM (TEE) (N/A) Subjective: Fairly persistent nausea, somewhat better when takes meds with food  Objective: Vital signs in last 24 hours: Temp:  [98.1 F (36.7 C)-99.5 F (37.5 C)] 98.4 F (36.9 C) (05/20 0439) Pulse Rate:  [44-96] 76 (05/20 0439) Cardiac Rhythm: Normal sinus rhythm;Heart block (05/20 0700) Resp:  [14-25] 14 (05/20 0439) BP: (117-167)/(59-113) 132/70 (05/20 0439) SpO2:  [85 %-99 %] 96 % (05/20 0439) Weight:  [71 kg (156 lb 8.4 oz)] 71 kg (156 lb 8.4 oz) (05/20 0439)  Hemodynamic parameters for last 24 hours:    Intake/Output from previous day: 05/19 0701 - 05/20 0700 In: 840 [P.O.:840] Out: 800 [Urine:800] Intake/Output this shift: No intake/output data recorded.  General appearance: alert, cooperative and no distress Heart: regular rate and rhythm Lungs: clear to auscultation bilaterally Abdomen: soft, non tender Extremities: no edema Wound: incis healing well  Lab Results: Recent Labs    03/04/18 0355  WBC 11.2*  HGB 9.0*  HCT 27.7*  PLT 102*   BMET:  Recent Labs    03/04/18 0355  NA 134*  K 4.0  CL 97*  CO2 29  GLUCOSE 93  BUN 12  CREATININE 0.73  CALCIUM 8.3*    PT/INR:  Recent Labs    03/06/18 0422  LABPROT 13.7  INR 1.06   ABG    Component Value Date/Time   PHART 7.344 (L) 03/01/2018 2142   HCO3 24.1 03/01/2018 2142   TCO2 27 03/02/2018 1723   ACIDBASEDEF 2.0 03/01/2018 2142   O2SAT 100.0 03/01/2018 2142   CBG (last 3)  Recent Labs    03/03/18 1147  GLUCAP 91    Meds Scheduled Meds: . acetaminophen  1,000 mg Oral Q6H  . amiodarone  200 mg Oral BID  . aspirin EC  81 mg Oral Daily  . bisacodyl  10 mg Oral Daily   Or  . bisacodyl  10 mg Rectal Daily  . Chlorhexidine Gluconate Cloth  6 each Topical  Daily  . docusate sodium  200 mg Oral Daily  . enoxaparin (LOVENOX) injection  40 mg Subcutaneous QHS  . furosemide  40 mg Oral Daily  . levothyroxine  88 mcg Oral QAC breakfast  . metoCLOPramide (REGLAN) injection  10 mg Intravenous Q6H  . potassium chloride  20 mEq Oral Daily  . sodium chloride flush  10-40 mL Intracatheter Q12H  . sodium chloride flush  3 mL Intravenous Q12H  . sodium chloride flush  3 mL Intravenous Q12H  . warfarin  2.5 mg Oral q1800  . Warfarin - Physician Dosing Inpatient   Does not apply q1800   Continuous Infusions: . sodium chloride    . sodium chloride     PRN Meds:.sodium chloride, metoprolol tartrate, ondansetron (ZOFRAN) IV, oxyCODONE, promethazine, sodium chloride flush, sodium chloride flush, sodium chloride flush, traMADol  Xrays Dg Chest Port 1 View  Result Date: 03/06/2018 CLINICAL DATA:  Chest tube removal EXAM: PORTABLE CHEST 1 VIEW COMPARISON:  Mar 05, 2018 FINDINGS: Right chest tube has been removed. Temporary pacemaker wires no longer appreciable. No evident pneumothorax. There is no appreciable edema or consolidation. Heart size and pulmonary vascular normal. Status post mitral valve replacement. No adenopathy. No bone lesions. IMPRESSION: Chest tube  and pacemaker wires no longer present. No pneumothorax. No edema or consolidation. Stable cardiac silhouette. Electronically Signed   By: Lowella Grip III M.D.   On: 03/06/2018 07:29   Dg Chest Port 1 View  Result Date: 03/05/2018 CLINICAL DATA:  Patient with chest tube.  Follow-up EXAM: PORTABLE CHEST 1 VIEW COMPARISON:  Chest radiograph 03/04/2018. FINDINGS: Monitoring leads overlie the patient. Patient is rotated to the left. Stable cardiac and mediastinal contours. Right chest tube remains in place. Persistent small left pleural effusion and left lung base opacities. Right lung is clear. No definite pneumothorax. IMPRESSION: Right chest tube remains in place.  No definite pneumothorax. Small  left pleural effusion and underlying opacities. Electronically Signed   By: Lovey Newcomer M.D.   On: 03/05/2018 10:19   Dg Chest Port 1 View  Result Date: 03/04/2018 CLINICAL DATA:  Shortness of breath with central chest pain. Some cough and congestion. EXAM: PORTABLE CHEST 1 VIEW COMPARISON:  03/03/2018 FINDINGS: Right-sided chest tube unchanged with tip over the apex. Lungs are well inflated without focal airspace consolidation or effusion. No pneumothorax. Cardiomediastinal silhouette and remainder of the exam is unchanged. IMPRESSION: No active disease. Right-sided chest tube unchanged.  No pneumothorax. Electronically Signed   By: Marin Olp M.D.   On: 03/04/2018 09:53    Assessment/Plan: S/P Procedure(s) (LRB): MINIMALLY INVASIVE MITRAL VALVE REPAIR (MVR) (Right) TRANSESOPHAGEAL ECHOCARDIOGRAM (TEE) (N/A)  1 doing well overall 2 some episodes of afib , PVC's, most;y sinus with good rate control. BP well controlled. INR only 1.06 3 CXR looks good- sats good on RA 4 no other new labs 5 nausea is fairly persistent- may be related to amiodarone, hopefully will resolve without having to stop  LOS: 5 days    John Giovanni 03/06/2018  I have seen and examined the patient and agree with the assessment and plan as outlined.  Maintaining NSR w/ just a few brief episodes of rate-controlled post op Afib.  Nausea associated with amiodarone, especially on empty stomach.  Patient wants to go home today if possible.  Will see how she does this morning and tentatively plan d/c home this afternoon.  If she can't tolerate amiodarone will have to stop and start low dose metoprolol.  Continue low dose ASA until therapeutic on warfarin.  [name], MD 03/06/2018 8:25 AM

## 2018-03-07 DIAGNOSIS — I340 Nonrheumatic mitral (valve) insufficiency: Secondary | ICD-10-CM

## 2018-03-07 LAB — PROTIME-INR
INR: 1.02
PROTHROMBIN TIME: 13.3 s (ref 11.4–15.2)

## 2018-03-07 MED ORDER — TRAMADOL HCL 50 MG PO TABS: 50.0000 mg | ORAL_TABLET | Freq: Four times a day (QID) | ORAL | 0 refills | Status: AC | PRN

## 2018-03-07 MED FILL — Heparin Sodium (Porcine) Inj 1000 Unit/ML: INTRAMUSCULAR | Qty: 10 | Status: AC

## 2018-03-07 MED FILL — Electrolyte-R (PH 7.4) Solution: INTRAVENOUS | Qty: 4000 | Status: AC

## 2018-03-07 MED FILL — Lidocaine HCl Local Soln Prefilled Syringe 100 MG/5ML (2%): INTRAMUSCULAR | Qty: 15 | Status: AC

## 2018-03-07 MED FILL — Heparin Sodium (Porcine) Inj 1000 Unit/ML: INTRAMUSCULAR | Qty: 30 | Status: AC

## 2018-03-07 MED FILL — Sodium Bicarbonate IV Soln 8.4%: INTRAVENOUS | Qty: 50 | Status: AC

## 2018-03-07 MED FILL — Magnesium Sulfate Inj 50%: INTRAMUSCULAR | Qty: 10 | Status: AC

## 2018-03-07 MED FILL — Phenylephrine HCl IV Soln 10 MG/ML: INTRAVENOUS | Qty: 2 | Status: AC

## 2018-03-07 MED FILL — Dexmedetomidine HCl in NaCl 0.9% IV Soln 400 MCG/100ML: INTRAVENOUS | Qty: 100 | Status: AC

## 2018-03-07 MED FILL — Sodium Chloride IV Soln 0.9%: INTRAVENOUS | Qty: 50 | Status: AC

## 2018-03-07 MED FILL — Sodium Chloride IV Soln 0.9%: INTRAVENOUS | Qty: 2000 | Status: AC

## 2018-03-07 MED FILL — Mannitol IV Soln 20%: INTRAVENOUS | Qty: 1000 | Status: AC

## 2018-03-07 MED FILL — Potassium Chloride Inj 2 mEq/ML: INTRAVENOUS | Qty: 40 | Status: AC

## 2018-03-07 MED FILL — Heparin Sodium (Porcine) Inj 1000 Unit/ML: INTRAMUSCULAR | Qty: 2500 | Status: AC

## 2018-03-07 NOTE — Progress Notes (Addendum)
[address]       [place] [address]             [phone]      6 Days Post-Op Procedure(s) (LRB): MINIMALLY INVASIVE MITRAL VALVE REPAIR (MVR) (Right) TRANSESOPHAGEAL ECHOCARDIOGRAM (TEE) (N/A) Subjective: Feels much better, no further nausea  Objective: Vital signs in last 24 hours: Temp:  [98.4 F (36.9 C)-99.2 F (37.3 C)] 98.4 F (36.9 C) (05/21 0428) Pulse Rate:  [52-85] 73 (05/21 0428) Cardiac Rhythm: Normal sinus rhythm;Heart block (05/20 1900) Resp:  [14-24] 17 (05/21 0428) BP: (121-134)/(54-79) 134/62 (05/21 0428) SpO2:  [96 %-100 %] 98 % (05/21 0428) Weight:  [69.2 kg (152 lb 9.6 oz)] 69.2 kg (152 lb 9.6 oz) (05/21 0428)  Hemodynamic parameters for last 24 hours:    Intake/Output from previous day: 05/20 0701 - 05/21 0700 In: 1263 [P.O.:1260; I.V.:3] Out: -  Intake/Output this shift: No intake/output data recorded.  General appearance: alert, cooperative and no distress Heart: regular rate and rhythm Lungs: clear to auscultation bilaterally Abdomen: benign Extremities: no edema Wound: incis healing well  Lab Results: No results for input(s): WBC, HGB, HCT, PLT in the last 72 hours. BMET: No results for input(s): NA, K, CL, CO2, GLUCOSE, BUN, CREATININE, CALCIUM in the last 72 hours.  PT/INR:  Recent Labs    03/07/18 0419  LABPROT 13.3  INR 1.02   ABG    Component Value Date/Time   PHART 7.344 (L) 03/01/2018 2142   HCO3 24.1 03/01/2018 2142   TCO2 27 03/02/2018 1723   ACIDBASEDEF 2.0 03/01/2018 2142   O2SAT 100.0 03/01/2018 2142   CBG (last 3)  No results for input(s): GLUCAP in the last 72 hours.  Meds Scheduled Meds: . amiodarone  200 mg Oral BID  . aspirin EC  81 mg Oral Daily  . bisacodyl  10 mg Oral Daily   Or  . bisacodyl  10 mg Rectal Daily  . Chlorhexidine Gluconate Cloth  6 each Topical Daily  . docusate sodium  200 mg Oral Daily  . furosemide  40 mg Oral Daily  . levothyroxine  88 mcg Oral QAC  breakfast  . potassium chloride  20 mEq Oral Daily  . sodium chloride flush  10-40 mL Intracatheter Q12H  . sodium chloride flush  3 mL Intravenous Q12H  . sodium chloride flush  3 mL Intravenous Q12H  . warfarin  5 mg Oral q1800  . Warfarin - Physician Dosing Inpatient   Does not apply q1800   Continuous Infusions: . sodium chloride    . sodium chloride     PRN Meds:.sodium chloride, metoprolol tartrate, ondansetron (ZOFRAN) IV, oxyCODONE, sodium chloride flush, sodium chloride flush, sodium chloride flush, traMADol  Xrays Dg Chest Port 1 View  Result Date: 03/06/2018 CLINICAL DATA:  Chest tube removal EXAM: PORTABLE CHEST 1 VIEW COMPARISON:  Mar 05, 2018 FINDINGS: Right chest tube has been removed. Temporary pacemaker wires no longer appreciable. No evident pneumothorax. There is no appreciable edema or consolidation. Heart size and pulmonary vascular normal. Status post mitral valve replacement. No adenopathy. No bone lesions. IMPRESSION: Chest tube and pacemaker wires no longer present. No pneumothorax. No edema or consolidation. Stable cardiac silhouette. Electronically Signed   By: Lowella Grip III M.D.   On: 03/06/2018 07:29   Dg Chest Port 1 View  Result Date: 03/05/2018 CLINICAL DATA:  Patient with chest tube.  Follow-up EXAM: PORTABLE CHEST 1 VIEW COMPARISON:  Chest radiograph 03/04/2018. FINDINGS:  Monitoring leads overlie the patient. Patient is rotated to the left. Stable cardiac and mediastinal contours. Right chest tube remains in place. Persistent small left pleural effusion and left lung base opacities. Right lung is clear. No definite pneumothorax. IMPRESSION: Right chest tube remains in place.  No definite pneumothorax. Small left pleural effusion and underlying opacities. Electronically Signed   By: Lovey Newcomer M.D.   On: 03/05/2018 10:19    Assessment/Plan: S/P Procedure(s) (LRB): MINIMALLY INVASIVE MITRAL VALVE REPAIR (MVR) (Right) TRANSESOPHAGEAL ECHOCARDIOGRAM  (TEE) (N/A)  1 appears more comfortable, nausea is resolved currently but using zofran 2 sinus rhythm with no further afib noted on monitor 3 will discharge home , will give some zofran for prn use 4  Cont current coumadin dose  LOS: 6 days    John Giovanni 03/07/2018  I have seen and examined the patient and agree with the assessment and plan as outlined.  D/C home today.  Stop lasix and potassium today.   Continue amiodarone 200 bid x7 days then 200 mg daily. Tramadol as needed for severe pain.  Tylenol for mild pain/soreness.  Instructions given.  [name], MD 03/07/2018 9:23 AM

## 2018-03-07 NOTE — Anesthesia Postprocedure Evaluation (Signed)
Anesthesia Post Note  Patient: Tammy Montoya  Procedure(s) Performed: MINIMALLY INVASIVE MITRAL VALVE REPAIR (MVR) (Right Chest) TRANSESOPHAGEAL ECHOCARDIOGRAM (TEE) (N/A )     Patient location during evaluation: SICU Anesthesia Type: General Level of consciousness: sedated and patient remains intubated per anesthesia plan Pain management: pain level controlled Vital Signs Assessment: post-procedure vital signs reviewed and stable Respiratory status: patient on ventilator - see flowsheet for VS and patient remains intubated per anesthesia plan (weaning from ventilator) Cardiovascular status: stable (very stable post op) Postop Assessment: no apparent nausea or vomiting Anesthetic complications: no Comments: Delayed entry, pt eval post op in SICU         Hadja Harral,E. Lundy [place]

## 2018-03-08 ENCOUNTER — Ambulatory Visit (INDEPENDENT_AMBULATORY_CARE_PROVIDER_SITE_OTHER): Payer: Medicare Other | Admitting: *Deleted

## 2018-03-08 DIAGNOSIS — I4891 Unspecified atrial fibrillation: Secondary | ICD-10-CM | POA: Diagnosis not present

## 2018-03-08 DIAGNOSIS — Z9889 Other specified postprocedural states: Secondary | ICD-10-CM

## 2018-03-08 DIAGNOSIS — Z5181 Encounter for therapeutic drug level monitoring: Secondary | ICD-10-CM | POA: Diagnosis not present

## 2018-03-08 DIAGNOSIS — I341 Nonrheumatic mitral (valve) prolapse: Secondary | ICD-10-CM | POA: Diagnosis not present

## 2018-03-08 LAB — TYPE AND SCREEN
ABO/RH(D): AB POS
Antibody Screen: NEGATIVE
Unit division: 0
Unit division: 0

## 2018-03-08 LAB — BPAM RBC
BLOOD PRODUCT EXPIRATION DATE: 201906042359
BLOOD PRODUCT EXPIRATION DATE: 201906042359
ISSUE DATE / TIME: 201905151051
ISSUE DATE / TIME: 201905151051
UNIT TYPE AND RH: 8400
UNIT TYPE AND RH: 8400

## 2018-03-08 LAB — POCT INR: INR: 1 — AB (ref 2.0–3.0)

## 2018-03-08 NOTE — Patient Instructions (Addendum)
A full discussion of the nature of anticoagulants has been carried out.  A benefit risk analysis has been presented to the patient, so that they understand the justification for choosing anticoagulation at this time. The need for frequent and regular monitoring, precise dosage adjustment and compliance is stressed.  Side effects of potential bleeding are discussed.  The patient should avoid any OTC items containing aspirin or ibuprofen, and should avoid great swings in general diet.  Avoid alcohol consumption.  Call if any signs of abnormal bleeding.   Description    Continue taking 1 tablet (5mg ) daily Recheck INR in 1 week Scheduled visit with [facility]

## 2018-03-16 ENCOUNTER — Encounter: Payer: Self-pay | Admitting: Adult Health

## 2018-03-16 ENCOUNTER — Ambulatory Visit (INDEPENDENT_AMBULATORY_CARE_PROVIDER_SITE_OTHER): Payer: Medicare Other | Admitting: Pharmacist Clinician (PhC)/ Clinical Pharmacy Specialist

## 2018-03-16 ENCOUNTER — Ambulatory Visit: Payer: Medicare Other | Admitting: Adult Health

## 2018-03-16 VITALS — BP 102/74 | HR 88 | Ht 69.0 in | Wt 150.6 lb

## 2018-03-16 DIAGNOSIS — Z5181 Encounter for therapeutic drug level monitoring: Secondary | ICD-10-CM | POA: Diagnosis not present

## 2018-03-16 DIAGNOSIS — I480 Paroxysmal atrial fibrillation: Secondary | ICD-10-CM | POA: Diagnosis not present

## 2018-03-16 DIAGNOSIS — I4891 Unspecified atrial fibrillation: Secondary | ICD-10-CM

## 2018-03-16 DIAGNOSIS — I341 Nonrheumatic mitral (valve) prolapse: Secondary | ICD-10-CM | POA: Diagnosis not present

## 2018-03-16 DIAGNOSIS — Z9889 Other specified postprocedural states: Secondary | ICD-10-CM

## 2018-03-16 DIAGNOSIS — Z79899 Other long term (current) drug therapy: Secondary | ICD-10-CM

## 2018-03-16 LAB — POCT INR: INR: 1.7 — AB (ref 2.0–3.0)

## 2018-03-16 MED ORDER — AMIODARONE HCL 200 MG PO TABS: 200.0000 mg | ORAL_TABLET | Freq: Every day | ORAL | 3 refills | Status: DC

## 2018-03-16 NOTE — Progress Notes (Signed)
Cardiology Office Note   Date:  03/16/2018   ID:  Tammy Montoya, DOB 01/11/1951, MRN [id number]  PCP:  Elisabeth Cara PA-C  Cardiologist:  [facility]  Chief Complaint  Patient presents with  . Follow-up  . Hospitalization Follow-up     History of Present Illness: Tammy Montoya is a 67 y.o. 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   Post procedure she developed rate control atrial fib and was started on amiodarone. She was also started on coumadin 5 mg.   The patient and her husband comes today with multiple concerns, multiple questions, and need for reassurance and explanation concerning the surgery, her recovery, her medications, and other psychosomatic complaints.  The patient is having trouble sleeping, she is having soreness in her chest post surgery, she has questions about her activity level, loss of memory of events in the hospital.  Her husband has multiple questions concerning her medications, atrial fibrillation, and planned follow-ups.  They have also spoken at length with our pharmacist after having had PT/INR completed as she is on Coumadin.   Past Medical History:  Diagnosis Date  . Basal cell carcinoma of right lower leg   . Dyspnea    all the time  . Fibrocystic disease of breast   . Hypothyroid   . IBS (irritable bowel syndrome)   . Mitral regurgitation   . MVP (mitral valve prolapse)    Murmur  . Pernicious anemia   . Rheumatic fever    as child  . S/P minimally invasive mitral valve repair 03/01/2018   Complex valvuloplasty including artificial Gore-tex neochord placement x12 and Sorin Memo Ring Annuloplasty 4DM-32, size 32 via right mini thoracotomy approach  . Simple endometrial hyperplasia without atypia   . Sleep apnea     Past  Surgical History:  Procedure Laterality Date  . APPENDECTOMY    . LAPAROSCOPIC CHOLECYSTECTOMY  03/2002  . MITRAL VALVE REPAIR Right 03/01/2018   Procedure: MINIMALLY INVASIVE MITRAL VALVE REPAIR (MVR);  Surgeon: [name], MD;  Location: [facility];  Service: Open Heart Surgery;  Laterality: Right;  Using LivaNova Memo 4D Size 32 Annuloplasty Ring  . RIGHT/LEFT HEART CATH AND CORONARY ANGIOGRAPHY N/A 02/20/2018   Procedure: RIGHT/LEFT HEART CATH AND CORONARY ANGIOGRAPHY;  Surgeon: Sherren Mocha, MD;  Location: [facility];  Service: Cardiovascular;  Laterality: N/A;  . TEE WITHOUT CARDIOVERSION N/A 12/09/2017   Procedure: TRANSESOPHAGEAL ECHOCARDIOGRAM (TEE);  Surgeon: Lelon Perla, MD;  Location: [facility] ENDOSCOPY;  Service: Cardiovascular;  Laterality: N/A;  . TEE WITHOUT CARDIOVERSION N/A 03/01/2018   Procedure: TRANSESOPHAGEAL ECHOCARDIOGRAM (TEE);  Surgeon: [name], MD;  Location: [facility];  Service: Open Heart Surgery;  Laterality: N/A;  . TONSILLECTOMY AND ADENOIDECTOMY       Current Outpatient Medications  Medication Sig Dispense Refill  . amiodarone (PACERONE) 200 MG tablet Take 1 tablet (200 mg total) by mouth daily. 30 tablet 3  . aspirin EC 81 MG EC tablet Take 1 tablet (81 mg total) by mouth daily.    . cetirizine (ZYRTEC) 10 MG tablet Take 10 mg by mouth daily.    . Cholecalciferol (VITAMIN D) 2000 UNITS CAPS Take 2,000 Units by mouth daily.     Marland Kitchen estazolam (PROSOM) 2 MG tablet Take 1-2 mg by mouth at bedtime as needed (for  sleep).     Marland Kitchen estradiol (CLIMARA - DOSED IN MG/24 HR) 0.05 mg/24hr patch Place 1 patch (0.05 mg total) onto the skin once a week. (Patient taking differently: Place 0.05 mg onto the skin every Sunday. ) 4 patch 1  . levothyroxine (SYNTHROID, LEVOTHROID) 88 MCG tablet Take 88 mcg by mouth daily before breakfast.     . Multiple Vitamins-Minerals (MULTIVITAL PO) Take 1 tablet by mouth daily.     . progesterone (PROMETRIUM) 200 MG capsule Take 1  capsule (200 mg total) by mouth daily. 90 capsule 0  . sodium chloride (OCEAN) 0.65 % SOLN nasal spray Place 1-2 sprays into both nostrils as needed for congestion.    Marland Kitchen warfarin (COUMADIN) 5 MG tablet Take 1 tablet (5 mg total) by mouth daily at 6 PM. As directed by the coumadin clinic 100 tablet 1  . azelastine (ASTELIN) 0.1 % nasal spray Place 1 spray into both nostrils daily as needed for allergies.     . cyanocobalamin (,VITAMIN B-12,) 1000 MCG/ML injection Inject 1,000 mcg into the muscle 3 (three) times a week.    . esomeprazole (NEXIUM) 40 MG capsule Take 40 mg by mouth as needed.     Marland Kitchen ketotifen (ZADITOR) 0.025 % ophthalmic solution Place 1 drop into both eyes 2 (two) times daily as needed (for dry eyes).    Marland Kitchen liothyronine (CYTOMEL) 5 MCG tablet Take 15 mcg by mouth daily.     . ondansetron (ZOFRAN) 4 MG tablet Take 1 tablet (4 mg total) by mouth every 8 (eight) hours as needed for nausea or vomiting. (Patient not taking: Reported on 03/16/2018) 20 tablet 0  . traMADol (ULTRAM) 50 MG tablet Take 1-2 tablets (50-100 mg total) by mouth every 6 (six) hours as needed for moderate pain. (Patient not taking: Reported on 03/16/2018) 30 tablet 0   No current facility-administered medications for this visit.     Allergies:   Gluten meal and Protonix [pantoprazole]    Social History:  The patient  reports that she has never smoked. She has never used smokeless tobacco. She reports that she does not drink alcohol or use drugs.   Family History:  The patient's family history includes Diabetes in her brother, father, and paternal grandmother; Heart attack in her father; Heart failure in her brother; Osteoporosis in her mother.    ROS: All other systems are reviewed and negative. Unless otherwise mentioned in H&P    PHYSICAL EXAM: VS:  BP 102/74 (BP Location: Left Arm)   Pulse 88   Ht '5\' 9"'  (1.753 m)   Wt 150 lb 9.6 oz (68.3 kg)   LMP 09/17/2012   BMI 22.24 kg/m  , BMI Body mass index is  22.24 kg/m. GEN: Well nourished, well developed, in no acute distress  HEENT: normal  Neck: no JVD, carotid bruits, or masses CardiacRRR no murmurs, rubs, or gallops,no edema  Respiratory:  clear to auscultation bilaterally, normal work of breathing GI: soft, nontender, nondistended, + BS MS: no deformity or atrophy well-healed incisions on the right upper chest, left groin, without evidence of hematoma bleeding or infection. Skin: warm and dry, no rash,  Neuro:  Strength and sensation are intact Psych: euthymic mood, full affect, anxious, forgetful.   EKG: Not completed during this office visit.   Recent Labs: 02/27/2018: ALT 16 03/03/2018: Magnesium 1.8; TSH 1.252 03/04/2018: BUN 12; Creatinine, Ser 0.73; Hemoglobin 9.0; Platelets 102; Potassium 4.0; Sodium 134    Lipid Panel No results found for: CHOL, TRIG,  HDL, CHOLHDL, VLDL, LDLCALC, LDLDIRECT    Wt Readings from Last 3 Encounters:  03/16/18 150 lb 9.6 oz (68.3 kg)  03/07/18 152 lb 9.6 oz (69.2 kg)  02/27/18 157 lb (71.2 kg)      Other studies Reviewed: Echocardiogram: 02/2018  Study Conclusions  - Left ventricle: Systolic function was normal. The estimated ejection fraction was in the range of 55% to 60%. Wall motion was normal; there were no regional wall motion abnormalities. - Aortic valve: No evidence of vegetation. - Mitral valve: Thickening. Severe prolapse, involving the middle scallop of the posterior leaflet. There was severe regurgitation directed eccentrically. - Left atrium: The atrium was moderately to severely dilated. No evidence of thrombus in the atrial cavity or appendage. - Right atrium: No evidence of thrombus in the atrial cavity or appendage. - Atrial septum: No defect or patent foramen ovale was identified. - Tricuspid valve: No evidence of vegetation. - Pulmonic valve: No evidence of vegetation.  Impressions:  - Normal LV function; severe prolapse of posterior MV leaflet  (P2); severe eccentric MR; moderate to severe LAE; mild TR.  Treatments: surgery:    Minimally-Invasive Mitral Valve Repair Complex valvuloplasty including artificial Gore-tex neochord placement x12 Sorin Memo 4D Ring Annuloplasty (size65m, catalog #[id number], serial #[id number]    ASSESSMENT AND PLAN:  1.  Status post minimally invasive mitral valve repair: I have answered multiple questions concerning postoperative course.  I have given reassurance that some of the discomfort she is feeling is very normal, she will also have some mild insomnia as a result of anesthesia.  I talked with her at length about slowly increasing her activity but not overdoing.  When she sees cardiothoracic surgeon he will release her to move forward with cardiac rehab.  I have encouraged her to participate.  She will follow-up with Dr. ORicard Dillonof previously scheduled appointment.  2.  Postoperative atrial fibrillation: The patient is currently in normal sinus rhythm.  She continues on amiodarone 200 mg twice daily but I will decrease this to 200 mg daily.  When she follows up with cardiothoracic surgeon it may be possible for her to be discontinued on this medication.  This has been explained to her.  She has had an awful lot of nausea associated with taking this medication.  Is my hope that this can be removed as she remains in regular rhythm.  Multiple questions are answered.  She has met with pharmacist for Coumadin dosage adjustment.  3.  Generalized fatigue: I will check some labs to evaluate further as she does have a history of thyroid disease and anemia.  Will check CBC, TSH, and a BMET.    4.  History of pernicious anemia: She has not had a B12 shot since prior to surgery.  She is advised to follow-up with her PCP.  5.  Insomnia, I think some of this is anxiety induced but she does have a history and is on medication but she has not been taking it as she has been afraid to mix it  with her other medications which are new to her.  I have advised that she can take a low-dose of her medication as needed to allow her to get some rest which will help with her strength and stamina.   Current medicines are reviewed at length with the patient today.  I have spent greater than 40 minutes with this patient, her husband, answering multiple questions providing multiple explanations and reviewing her medication adjustments and planned recovery strategy.  Labs/ tests ordered today include: CBC, BMET, TSH. Phill Myron. [place] Pugh, ANP, AACC   03/16/2018 12:53 PM    [facility] [address], [place], [place] [address] Phone: [phone]; Fax: [phone]

## 2018-03-16 NOTE — Patient Instructions (Signed)
Medication Instructions:  DECREASE AMIODARONE 200MG  DAILY-ONLY  If you need a refill on your cardiac medications before your next appointment, please call your pharmacy.  Labwork: BMET,CBC AND TSH TODAY HERE IN OUR OFFICE AT [facility]  Take the provided lab slips with you to the lab for your blood draw.   Special Instructions: OK TO TAKE YOUR SLEEPING MEDICATION AS DISCUSSED  Follow-Up: Your physician wants you to follow-up in: [place]    Thank you for choosing [facility] at [facility]!!

## 2018-03-16 NOTE — Patient Instructions (Signed)
Description   Take 1 tablet daily except 1.5 tablet each Thursday.  Repeat INR in 1 week  If you have followed by your primary MD please call us at [phone]    +

## 2018-03-17 LAB — BASIC METABOLIC PANEL
BUN/Creatinine Ratio: 9 — ABNORMAL LOW (ref 12–28)
BUN: 9 mg/dL (ref 8–27)
CHLORIDE: 100 mmol/L (ref 96–106)
CO2: 20 mmol/L (ref 20–29)
Calcium: 9.7 mg/dL (ref 8.7–10.3)
Creatinine, Ser: 0.95 mg/dL (ref 0.57–1.00)
GFR calc Af Amer: 72 mL/min/{1.73_m2} (ref >59)
GFR calc non Af Amer: 63 mL/min/{1.73_m2} (ref >59)
GLUCOSE: 73 mg/dL (ref 65–99)
POTASSIUM: 4.9 mmol/L (ref 3.5–5.2)
SODIUM: 139 mmol/L (ref 134–144)

## 2018-03-17 LAB — CBC
HEMOGLOBIN: 12.6 g/dL (ref 11.1–15.9)
Hematocrit: 38.3 % (ref 34.0–46.6)
MCH: 31.8 pg (ref 26.6–33.0)
MCHC: 32.9 g/dL (ref 31.5–35.7)
MCV: 97 fL (ref 79–97)
Platelets: 509 10*3/uL — ABNORMAL HIGH (ref 150–450)
RBC: 3.96 x10E6/uL (ref 3.77–5.28)
RDW: 13.5 % (ref 12.3–15.4)
WBC: 11.6 10*3/uL — ABNORMAL HIGH (ref 3.4–10.8)

## 2018-03-17 LAB — TSH: TSH: 10.72 u[IU]/mL — AB (ref 0.450–4.500)

## 2018-03-20 ENCOUNTER — Ambulatory Visit (INDEPENDENT_AMBULATORY_CARE_PROVIDER_SITE_OTHER): Payer: Self-pay | Admitting: Surgical

## 2018-03-20 ENCOUNTER — Other Ambulatory Visit: Payer: Self-pay | Admitting: Thoracic Surgery (Cardiothoracic Vascular Surgery)

## 2018-03-20 ENCOUNTER — Other Ambulatory Visit: Payer: Self-pay

## 2018-03-20 ENCOUNTER — Ambulatory Visit
Admission: RE | Admit: 2018-03-20 | Discharge: 2018-03-20 | Disposition: A | Discharge status: Home / Self Care | Payer: Medicare Other | Source: Ambulatory Visit | Attending: Thoracic Surgery (Cardiothoracic Vascular Surgery) | Admitting: Thoracic Surgery (Cardiothoracic Vascular Surgery)

## 2018-03-20 VITALS — BP 101/68 | HR 83 | Resp 16 | Ht 69.0 in | Wt 150.0 lb

## 2018-03-20 DIAGNOSIS — Z9889 Other specified postprocedural states: Secondary | ICD-10-CM

## 2018-03-20 DIAGNOSIS — I340 Nonrheumatic mitral (valve) insufficiency: Secondary | ICD-10-CM

## 2018-03-20 DIAGNOSIS — I341 Nonrheumatic mitral (valve) prolapse: Secondary | ICD-10-CM

## 2018-03-20 NOTE — Patient Instructions (Signed)
Discussed activity progression as well as driving restrictions.

## 2018-03-20 NOTE — Progress Notes (Signed)
[address]       [place] [address]             [phone]      Tammy Montoya [place] Medical Record #[id number] Date of Birth: 08-Nov-1950  Referring: Lelon Perla, MD Primary Care: Belva Bertin, [place], PA-C Primary Cardiologist: No primary care provider on file.   Chief Complaint:   POST OP FOLLOW UP CARDIOTHORACIC SURGERY OPERATIVE NOTE  Date of Procedure:                03/01/2018  Preoperative Diagnosis:      Severe Mitral Regurgitation  Postoperative Diagnosis:    Same  Procedure:        Minimally-Invasive Mitral Valve Repair             Complex valvuloplasty including artificial Gore-tex neochord placement x12             Sorin Memo 4D Ring Annuloplasty (size 88mm, catalog # [id number], serial # [id number])               Surgeon:        [name], MD  Assistant:       Nani Skillern, PA-C  Anesthesia:    Midge Minium, MD  Operative Findings: ? Fibroelastic deficiency type myxomatous degenerative disease ? Multiple ruptured primary chordae tendinae with flail segment of posterior leaflet ? Type II dysfunction with severe mitral regurgitation ? Normal left ventricular systolic function ? No residual mitral regurgitation after successful valve repair   History of Present Illness:    The patient is a 67 year old female status post the above described procedure.  She is seen in the office on today's date and routine follow-up.  She feels as though she is making excellent progress overall although she did have some difficulty last week feeling rather poorly.  She is having some mild soreness related to the incision but it is generally tolerable.  She did have postoperative atrial fibrillation and was recently decreased to 200 mg of amiodarone daily by the cardiologist.  She has being monitored in the Coumadin clinic for dosing adjustments.  She has had some fatigue that she relates to not getting her B12  injections but they have restarted and she is feeling much better in this regard.  She is having some insomnia.  There could be a relationship to her insomnia and her fatigue.  Is not had any fevers, chills or other constitutional symptoms.  She denies palpitations.  Any pain medication at this time.  Her TSH is noted to be elevated at 10 and I have asked her to follow-up with her primary care physician who manages this.  Hopefully we can discontinue the amiodarone at the next appointment but I feel it would be reasonable to continue this for 6 to 8 weeks postoperatively to try to prevent atrial fibrillation recurrence.  In general she is exceptionally pleased with her recovery.      Past Medical History:  Diagnosis Date  . Basal cell carcinoma of right lower leg   . Dyspnea    all the time  . Fibrocystic disease of breast   . Hypothyroid   . IBS (irritable bowel syndrome)   . Mitral regurgitation   . MVP (mitral valve prolapse)    Murmur  . Pernicious anemia   . Rheumatic fever    as child  . S/P minimally invasive mitral valve repair 03/01/2018   Complex  valvuloplasty including artificial Gore-tex neochord placement x12 and Sorin Memo Ring Annuloplasty 4DM-32, size 32 via right mini thoracotomy approach  . Simple endometrial hyperplasia without atypia   . Sleep apnea      Social History   Tobacco Use  Smoking Status Never Smoker  Smokeless Tobacco Never Used    Social History   Substance and Sexual Activity  Alcohol Use No  . Frequency: Never     Allergies  Allergen Reactions  . Gluten Meal Other (See Comments)    Stomach pain  . Protonix [Pantoprazole] Itching    Current Outpatient Medications  Medication Sig Dispense Refill  . amiodarone (PACERONE) 200 MG tablet Take 1 tablet (200 mg total) by mouth daily. 30 tablet 3  . aspirin EC 81 MG EC tablet Take 1 tablet (81 mg total) by mouth daily.    Marland Kitchen azelastine (ASTELIN) 0.1 % nasal spray Place 1 spray into both  nostrils daily as needed for allergies.     . cetirizine (ZYRTEC) 10 MG tablet Take 10 mg by mouth daily.    . Cholecalciferol (VITAMIN D) 2000 UNITS CAPS Take 2,000 Units by mouth daily.     . cyanocobalamin (,VITAMIN B-12,) 1000 MCG/ML injection Inject 1,000 mcg into the muscle 3 (three) times a week.    . esomeprazole (NEXIUM) 40 MG capsule Take 40 mg by mouth as needed.     . estazolam (PROSOM) 2 MG tablet Take 1-2 mg by mouth at bedtime as needed (for sleep).     Marland Kitchen estradiol (CLIMARA - DOSED IN MG/24 HR) 0.05 mg/24hr patch Place 1 patch (0.05 mg total) onto the skin once a week. (Patient taking differently: Place 0.05 mg onto the skin every Sunday. ) 4 patch 1  . ketotifen (ZADITOR) 0.025 % ophthalmic solution Place 1 drop into both eyes 2 (two) times daily as needed (for dry eyes).    Marland Kitchen levothyroxine (SYNTHROID, LEVOTHROID) 88 MCG tablet Take 88 mcg by mouth daily before breakfast.     . liothyronine (CYTOMEL) 5 MCG tablet Take 15 mcg by mouth daily.     . Multiple Vitamins-Minerals (MULTIVITAL PO) Take 1 tablet by mouth daily.     . progesterone (PROMETRIUM) 200 MG capsule Take 1 capsule (200 mg total) by mouth daily. 90 capsule 0  . sodium chloride (OCEAN) 0.65 % SOLN nasal spray Place 1-2 sprays into both nostrils as needed for congestion.    Marland Kitchen warfarin (COUMADIN) 5 MG tablet Take 1 tablet (5 mg total) by mouth daily at 6 PM. As directed by the coumadin clinic 100 tablet 1  . ondansetron (ZOFRAN) 4 MG tablet Take 1 tablet (4 mg total) by mouth every 8 (eight) hours as needed for nausea or vomiting. (Patient not taking: Reported on 03/16/2018) 20 tablet 0  . traMADol (ULTRAM) 50 MG tablet Take 1-2 tablets (50-100 mg total) by mouth every 6 (six) hours as needed for moderate pain. (Patient not taking: Reported on 03/16/2018) 30 tablet 0   No current facility-administered medications for this visit.        Physical Exam: BP 101/68 (BP Location: Right Arm, Patient Position: Sitting, Cuff  Size: Normal)   Pulse 83   Resp 16   Ht 5\' 9"  (1.753 m)   Wt 68 kg (150 lb)   LMP 09/17/2012   SpO2 98% Comment: ON RA  BMI 22.15 kg/m   General appearance: alert, cooperative and no distress Heart: regular rate and rhythm and no murmur Lungs: clear to auscultation bilaterally  Abdomen: benign exam Extremities: no edema Wound: incis healing well   Diagnostic Studies & Laboratory data:     Recent Radiology Findings:   Dg Chest 2 View  Result Date: 03/20/2018 CLINICAL DATA:  Status post mitral valve repair on Mar 01, 2018. EXAM: CHEST - 2 VIEW COMPARISON:  Portable chest x-ray of Mar 06, 2018 FINDINGS: The lungs are well-expanded. There is no focal infiltrate. There is a small amount of pleural thickening noted overlying the lateral aspect of the right eighth rib. There is no pneumothorax or pleural effusion. The heart and pulmonary vascularity are normal. The prosthetic mitral valve ring is in stable position. The bony thorax exhibits no acute abnormality. IMPRESSION: There is no active cardiopulmonary disease. Electronically Signed   By: David  Martinique M.D.   On: 03/20/2018 15:43      Recent Lab Findings: Lab Results  Component Value Date   WBC 11.6 (H) 03/16/2018   HGB 12.6 03/16/2018   HCT 38.3 03/16/2018   PLT 509 (H) 03/16/2018   GLUCOSE 73 03/16/2018   ALT 16 02/27/2018   AST 28 02/27/2018   NA 139 03/16/2018   K 4.9 03/16/2018   CL 100 03/16/2018   CREATININE 0.95 03/16/2018   BUN 9 03/16/2018   CO2 20 03/16/2018   TSH 10.720 (H) 03/16/2018   INR 1.7 (A) 03/16/2018   HGBA1C 5.1 02/27/2018      Assessment / Plan: Overall vision is recovering quite well.  She and her husband had multiple questions that hopefully have been answered adequately.  She is to continue her thyroid supplement and follow-up with her primary care physician although I told her that there may be some relationship to amiodarone the recent surgery and that testing may need to be more complete and  also repeated once she is off the amiodarone to better determine therapy.  She is to continue Coumadin for now.  We will see her again in 1 month or prior to that for any surgically related issues or at request.          John Giovanni, PA-C 03/20/2018 4:24 PM

## 2018-03-22 ENCOUNTER — Ambulatory Visit (INDEPENDENT_AMBULATORY_CARE_PROVIDER_SITE_OTHER): Payer: Medicare Other | Admitting: Pharmacist Clinician (PhC)/ Clinical Pharmacy Specialist

## 2018-03-22 DIAGNOSIS — Z7901 Long term (current) use of anticoagulants: Secondary | ICD-10-CM | POA: Diagnosis not present

## 2018-03-22 DIAGNOSIS — Z5181 Encounter for therapeutic drug level monitoring: Secondary | ICD-10-CM | POA: Diagnosis not present

## 2018-03-22 DIAGNOSIS — I341 Nonrheumatic mitral (valve) prolapse: Secondary | ICD-10-CM

## 2018-03-22 DIAGNOSIS — Z9889 Other specified postprocedural states: Secondary | ICD-10-CM

## 2018-03-22 DIAGNOSIS — I4891 Unspecified atrial fibrillation: Secondary | ICD-10-CM | POA: Diagnosis not present

## 2018-03-22 LAB — POCT INR: INR: 1.6 — AB (ref 2.0–3.0)

## 2018-03-22 NOTE — Patient Instructions (Signed)
Description   Take 1 tablet daily except 1.5 tablet each Wednesday and Saturday.   Repeat INR in 1 week  If you have followed by your primary MD please call us at [phone]

## 2018-03-30 ENCOUNTER — Ambulatory Visit (INDEPENDENT_AMBULATORY_CARE_PROVIDER_SITE_OTHER): Payer: Medicare Other | Admitting: Pharmacist

## 2018-03-30 DIAGNOSIS — I341 Nonrheumatic mitral (valve) prolapse: Secondary | ICD-10-CM

## 2018-03-30 DIAGNOSIS — Z5181 Encounter for therapeutic drug level monitoring: Secondary | ICD-10-CM

## 2018-03-30 DIAGNOSIS — Z9889 Other specified postprocedural states: Secondary | ICD-10-CM

## 2018-03-30 DIAGNOSIS — I4891 Unspecified atrial fibrillation: Secondary | ICD-10-CM

## 2018-03-30 LAB — POCT INR: INR: 1.8 — AB (ref 2.0–3.0)

## 2018-04-06 ENCOUNTER — Ambulatory Visit (INDEPENDENT_AMBULATORY_CARE_PROVIDER_SITE_OTHER): Payer: Medicare Other | Admitting: Pharmacist Clinician (PhC)/ Clinical Pharmacy Specialist

## 2018-04-06 DIAGNOSIS — I4891 Unspecified atrial fibrillation: Secondary | ICD-10-CM | POA: Diagnosis not present

## 2018-04-06 DIAGNOSIS — Z5181 Encounter for therapeutic drug level monitoring: Secondary | ICD-10-CM

## 2018-04-06 DIAGNOSIS — Z9889 Other specified postprocedural states: Secondary | ICD-10-CM

## 2018-04-06 DIAGNOSIS — I341 Nonrheumatic mitral (valve) prolapse: Secondary | ICD-10-CM

## 2018-04-06 LAB — POCT INR: INR: 1.9 — AB (ref 2.0–3.0)

## 2018-04-06 NOTE — Patient Instructions (Signed)
Description   Increase 1.5 tablet daily except 1 tablet each Monday and Friday.   Repeat INR in 1 week . If you have followed by your primary MD please call us at [phone]

## 2018-04-14 ENCOUNTER — Encounter: Payer: Self-pay | Admitting: Cardiology

## 2018-04-14 ENCOUNTER — Other Ambulatory Visit: Payer: Self-pay | Admitting: Thoracic Surgery (Cardiothoracic Vascular Surgery)

## 2018-04-14 DIAGNOSIS — Z9889 Other specified postprocedural states: Secondary | ICD-10-CM

## 2018-04-17 ENCOUNTER — Telehealth: Payer: Self-pay | Admitting: Cardiology

## 2018-04-17 NOTE — Telephone Encounter (Signed)
New message   Patient requesting referral to cardiac rehab at  [facility]  Fax [phone] Attn: Fredrich Birks

## 2018-04-17 NOTE — Telephone Encounter (Signed)
Left message for Tammy Montoya to fax the paperwork for rehab to [place] so I can get signed.

## 2018-04-18 ENCOUNTER — Ambulatory Visit (INDEPENDENT_AMBULATORY_CARE_PROVIDER_SITE_OTHER): Payer: Medicare Other | Admitting: Pharmacist

## 2018-04-18 DIAGNOSIS — Z5181 Encounter for therapeutic drug level monitoring: Secondary | ICD-10-CM

## 2018-04-18 DIAGNOSIS — I341 Nonrheumatic mitral (valve) prolapse: Secondary | ICD-10-CM | POA: Diagnosis not present

## 2018-04-18 DIAGNOSIS — I4891 Unspecified atrial fibrillation: Secondary | ICD-10-CM | POA: Diagnosis not present

## 2018-04-18 DIAGNOSIS — Z9889 Other specified postprocedural states: Secondary | ICD-10-CM

## 2018-04-18 LAB — POCT INR: INR: 1.4 — AB (ref 2.0–3.0)

## 2018-04-18 NOTE — Telephone Encounter (Signed)
Follow up    Tammy Montoya from cardiac rehab states the referral has been signed and the surgeon took care of it

## 2018-04-18 NOTE — Telephone Encounter (Signed)
Spoke with pt, made aware.

## 2018-04-24 ENCOUNTER — Encounter: Payer: Self-pay | Admitting: Cardiology

## 2018-04-24 ENCOUNTER — Ambulatory Visit (INDEPENDENT_AMBULATORY_CARE_PROVIDER_SITE_OTHER): Payer: Medicare Other | Admitting: Pharmacist Clinician (PhC)/ Clinical Pharmacy Specialist

## 2018-04-24 ENCOUNTER — Ambulatory Visit
Admission: RE | Admit: 2018-04-24 | Discharge: 2018-04-24 | Disposition: A | Discharge status: Home / Self Care | Payer: Medicare Other | Source: Ambulatory Visit | Attending: Thoracic Surgery (Cardiothoracic Vascular Surgery) | Admitting: Thoracic Surgery (Cardiothoracic Vascular Surgery)

## 2018-04-24 ENCOUNTER — Ambulatory Visit (INDEPENDENT_AMBULATORY_CARE_PROVIDER_SITE_OTHER): Payer: Self-pay | Admitting: Thoracic Surgery (Cardiothoracic Vascular Surgery)

## 2018-04-24 ENCOUNTER — Encounter: Payer: Self-pay | Admitting: Thoracic Surgery (Cardiothoracic Vascular Surgery)

## 2018-04-24 ENCOUNTER — Ambulatory Visit: Payer: Medicare Other | Admitting: Cardiology

## 2018-04-24 VITALS — BP 108/60 | HR 81 | Ht 69.0 in | Wt 156.0 lb

## 2018-04-24 VITALS — BP 125/78 | HR 86 | Resp 20 | Ht 69.0 in | Wt 157.0 lb

## 2018-04-24 DIAGNOSIS — I4891 Unspecified atrial fibrillation: Secondary | ICD-10-CM | POA: Diagnosis not present

## 2018-04-24 DIAGNOSIS — Z9889 Other specified postprocedural states: Secondary | ICD-10-CM

## 2018-04-24 DIAGNOSIS — I480 Paroxysmal atrial fibrillation: Secondary | ICD-10-CM

## 2018-04-24 DIAGNOSIS — Z5181 Encounter for therapeutic drug level monitoring: Secondary | ICD-10-CM

## 2018-04-24 DIAGNOSIS — I341 Nonrheumatic mitral (valve) prolapse: Secondary | ICD-10-CM

## 2018-04-24 LAB — POCT INR: INR: 3 (ref 2.0–3.0)

## 2018-04-24 MED ORDER — AMIODARONE HCL 200 MG PO TABS: 200.0000 mg | ORAL_TABLET | Freq: Every day | ORAL | 0 refills | Status: DC

## 2018-04-24 NOTE — Patient Instructions (Signed)
Description   Take 1 tablet today Monday July 8, then continue with 1.5 tablet daily . Repeat INR in 10 days

## 2018-04-24 NOTE — Addendum Note (Signed)
Addended by: Patria Mane A on: 04/24/2018 12:52 PM   Modules accepted: Orders

## 2018-04-24 NOTE — Patient Instructions (Signed)
Continue all previous medications without any changes at this time  Continue taking Amiodarone 200 mg daily for 2 weeks or until your current prescription runs out, then stop taking it altogether.  Notify the pharmacist at the Coumadin clinic when you stop taking amiodarone  You may continue to gradually increase your physical activity as tolerated.  Refrain from any heavy lifting or strenuous use of your arms and shoulders until at least 8 weeks from the time of your surgery, and avoid activities that cause increased pain in your chest on the side of your surgical incision.  Otherwise you may continue to increase activities without any particular limitations.  Increase the intensity and duration of physical activity gradually.  You may return to driving an automobile as long as you are no longer requiring oral narcotic pain relievers during the daytime.  It would be wise to start driving only short distances during the daylight and gradually increase from there as you feel comfortable.

## 2018-04-24 NOTE — Patient Instructions (Signed)
Medication Instructions:  Stop amiodarone in 2 weeks (7/22)  Testing/Procedures: Your physician has requested that you have an echocardiogram. Echocardiography is a painless test that uses sound waves to create images of your heart. It provides your doctor with information about the size and shape of your heart and how well your heart's chambers and valves are working. This procedure takes approximately one hour. There are no restrictions for this procedure.  This will be done at our [facility] location:  [address]  Follow-Up: 3 months with Dr. Stanford Breed  Any Other Special Instructions Will Be Listed Below (If Applicable).  SBE prophylaxis card provided   If you need a refill on your cardiac medications before your next appointment, please call your pharmacy.

## 2018-04-24 NOTE — Progress Notes (Signed)
[address]       [place] [address]             [phone]     CARDIOTHORACIC SURGERY OFFICE NOTE  Referring Provider is Stanford Breed, Denice Bors, MD PCP is [place], [place], [place]   HPI:  Patient is a 67 year old female with history of mitral valve prolapse, rheumatic fever during childhood, frequent PVCs and palpitations, obstructive sleep apnea,and hypothyroidism who returns to the office today for routine follow-up status post minimally invasive mitral valve repair on Mar 01, 2018 for mitral valve prolapse with severe symptomatic primary mitral regurgitation.  The patient's early postoperative recovery in the hospital was uncomplicated although notable for the development of postoperative atrial fibrillation for which she was treated with amiodarone.  She was discharged from the hospital in sinus rhythm on the sixth postoperative day.  Since hospital discharge she has been seen in follow-up in our office on March 20, 2018 and she returns to our office today for routine follow-up.  She was seen earlier today by Dr. Stanford Breed and noted to remain in sinus rhythm on EKG.  She was  told that she may stop taking amiodarone in 2 weeks.  Her prothrombin time and Coumadin dosing has been managed through the Coumadin clinic.  She reports that she is doing exceptionally well.  Overall she is very pleased with her care.  She has one minor spot of persistent soreness located at the site of her left atrial lift retractor insertion site.  Otherwise she has no pain whatsoever related to her surgery.  She has been participating in the cardiac rehab program and making steady progress.  She denies significant exertional shortness of breath.   Current Outpatient Medications  Medication Sig Dispense Refill  . amiodarone (PACERONE) 200 MG tablet Take 1 tablet (200 mg total) by mouth daily. Discontinue 7/22 14 tablet 0  . aspirin EC 81 MG EC tablet Take 1 tablet (81 mg total) by mouth daily.     Marland Kitchen azelastine (ASTELIN) 0.1 % nasal spray Place 1 spray into both nostrils daily as needed for allergies.     . cetirizine (ZYRTEC) 10 MG tablet Take 10 mg by mouth daily.    . Cholecalciferol (VITAMIN D) 2000 UNITS CAPS Take 2,000 Units by mouth daily.     . cyanocobalamin (,VITAMIN B-12,) 1000 MCG/ML injection Inject 1,000 mcg into the muscle 3 (three) times a week.    . esomeprazole (NEXIUM) 40 MG capsule Take 40 mg by mouth as needed.     . estazolam (PROSOM) 2 MG tablet Take 1-2 mg by mouth at bedtime as needed (for sleep).     Marland Kitchen estradiol (CLIMARA - DOSED IN MG/24 HR) 0.05 mg/24hr patch Place 1 patch (0.05 mg total) onto the skin once a week. (Patient taking differently: Place 0.05 mg onto the skin every Sunday. ) 4 patch 1  . ketotifen (ZADITOR) 0.025 % ophthalmic solution Place 1 drop into both eyes 2 (two) times daily as needed (for dry eyes).    Marland Kitchen levothyroxine (SYNTHROID, LEVOTHROID) 88 MCG tablet Take 100 mcg by mouth daily before breakfast.     . liothyronine (CYTOMEL) 5 MCG tablet Take 15 mcg by mouth daily.     . Multiple Vitamins-Minerals (MULTIVITAL PO) Take 1 tablet by mouth daily.     . progesterone (PROMETRIUM) 200 MG capsule Take 1 capsule (200 mg total) by mouth daily. 90 capsule 0  . sodium chloride (OCEAN) 0.65 %  SOLN nasal spray Place 1-2 sprays into both nostrils as needed for congestion.    Marland Kitchen warfarin (COUMADIN) 5 MG tablet Take 1 tablet (5 mg total) by mouth daily at 6 PM. As directed by the coumadin clinic 100 tablet 1  . ondansetron (ZOFRAN) 4 MG tablet Take 1 tablet (4 mg total) by mouth every 8 (eight) hours as needed for nausea or vomiting. (Patient not taking: Reported on 04/24/2018) 20 tablet 0  . traMADol (ULTRAM) 50 MG tablet Take 1-2 tablets (50-100 mg total) by mouth every 6 (six) hours as needed for moderate pain. (Patient not taking: Reported on 04/24/2018) 30 tablet 0   No current facility-administered medications for this visit.       Physical  Exam:   BP 125/78   Pulse 86   Resp 20   Ht 5\' 9"  (1.753 m)   Wt 157 lb (71.2 kg)   LMP 09/17/2012   SpO2 97% Comment: RA  BMI 23.18 kg/m   General:  Well-appearing  Chest:   Clear to auscultation  CV:   Regular rate and rhythm without murmur  Incisions:  Well-healed  Abdomen:  Soft nontender  Extremities:  Warm and well-perfused  Diagnostic Tests:  CHEST - 2 VIEW  COMPARISON:  Radiographs of March 20, 2018.  FINDINGS: The heart size and mediastinal contours are within normal limits. No pneumothorax or pleural effusion is noted. Minimal bilateral pleural effusions are noted. No consolidative process is noted. The visualized skeletal structures are unremarkable.  IMPRESSION: Minimal bilateral pleural effusions. No other significant abnormality seen in the chest.   Electronically Signed   By: Marijo Conception, M.D.   On: 04/24/2018 13:35    Impression:  Patient is doing very well more than 6 weeks status post minimally invasive mitral valve repair  Plan:  We have not recommended any change the patient's current medications.  I agree that the patient may stop taking amiodarone within the next 2 weeks.  She may stop sooner if she has any problems with nausea related to the medicine.  I have instructed the patient to notify the pharmacist at the Coumadin clinic when she stops taking amiodarone.  She continues to maintain sinus rhythm she probably can stop taking Coumadin within 6 to 8 weeks.  I have encouraged the patient to continue to gradually increase her physical activity without any particular limitations at this time.  All of her questions have been addressed.  The patient has been reminded regarding the importance of dental hygiene and the lifelong need for antibiotic prophylaxis for all dental cleanings and other related invasive procedures.  Patient will return to our office for routine follow-up in approximately 3 months.  She will call and return sooner should  specific problems or questions arise.    [name], MD 04/24/2018 1:57 PM

## 2018-04-24 NOTE — Progress Notes (Signed)
HPI: FUPVCs, MVP/MV repair.Patient had a sleep study in June2018.She was noted to have PVCs. Echocardiogram January 2019 showed normal LV systolic function, prolapse of posterior MV leafletwith moderate to severe mitral regurgitation and moderate to severe left atrial enlargement by report (I have reviewed personally and there is severe prolapse of post leaflet with probable severe MR). Holter monitor January 2019 showed sinus rhythm with PACs, brief PAT, PVCs, rare couplet and nonsustained ventricular tachycardia. TEE 2/19 showed normal LV function, severe prolapse of P2 with severe MR, moderate to severe LAE. Cardiac catheterization May 2019 showed normal coronary arteries and findings consistent with severe mitral regurgitation.  Carotid Dopplers May 2019 showed no significant stenosis.  Patient underwent mitral valve repair on Mar 01, 2018.  She did develop postoperative atrial fibrillation treated with amiodarone.  TSH May 2019 normal.  Since last seen  she has mild dyspnea on exertion but no orthopnea, PND, pedal edema, palpitations or syncope.  She has had some bilateral shoulder pain that has been associated with belching.  Improved today.  Current Outpatient Medications  Medication Sig Dispense Refill  . aspirin EC 81 MG EC tablet Take 1 tablet (81 mg total) by mouth daily.    Marland Kitchen azelastine (ASTELIN) 0.1 % nasal spray Place 1 spray into both nostrils daily as needed for allergies.     . cetirizine (ZYRTEC) 10 MG tablet Take 10 mg by mouth daily.    . Cholecalciferol (VITAMIN D) 2000 UNITS CAPS Take 2,000 Units by mouth daily.     . cyanocobalamin (,VITAMIN B-12,) 1000 MCG/ML injection Inject 1,000 mcg into the muscle 3 (three) times a week.    . esomeprazole (NEXIUM) 40 MG capsule Take 40 mg by mouth as needed.     . estazolam (PROSOM) 2 MG tablet Take 1-2 mg by mouth at bedtime as needed (for sleep).     Marland Kitchen estradiol (CLIMARA - DOSED IN MG/24 HR) 0.05 mg/24hr patch Place 1 patch  (0.05 mg total) onto the skin once a week. (Patient taking differently: Place 0.05 mg onto the skin every Sunday. ) 4 patch 1  . ketotifen (ZADITOR) 0.025 % ophthalmic solution Place 1 drop into both eyes 2 (two) times daily as needed (for dry eyes).    Marland Kitchen levothyroxine (SYNTHROID, LEVOTHROID) 88 MCG tablet Take 88 mcg by mouth daily before breakfast.     . liothyronine (CYTOMEL) 5 MCG tablet Take 15 mcg by mouth daily.     . Multiple Vitamins-Minerals (MULTIVITAL PO) Take 1 tablet by mouth daily.     . ondansetron (ZOFRAN) 4 MG tablet Take 1 tablet (4 mg total) by mouth every 8 (eight) hours as needed for nausea or vomiting. 20 tablet 0  . progesterone (PROMETRIUM) 200 MG capsule Take 1 capsule (200 mg total) by mouth daily. 90 capsule 0  . sodium chloride (OCEAN) 0.65 % SOLN nasal spray Place 1-2 sprays into both nostrils as needed for congestion.    . traMADol (ULTRAM) 50 MG tablet Take 1-2 tablets (50-100 mg total) by mouth every 6 (six) hours as needed for moderate pain. 30 tablet 0  . warfarin (COUMADIN) 5 MG tablet Take 1 tablet (5 mg total) by mouth daily at 6 PM. As directed by the coumadin clinic 100 tablet 1   No current facility-administered medications for this visit.      Past Medical History:  Diagnosis Date  . Basal cell carcinoma of right lower leg   . Dyspnea    all  the time  . Fibrocystic disease of breast   . Hypothyroid   . IBS (irritable bowel syndrome)   . Mitral regurgitation   . MVP (mitral valve prolapse)    Murmur  . Pernicious anemia   . Rheumatic fever    as child  . S/P minimally invasive mitral valve repair 03/01/2018   Complex valvuloplasty including artificial Gore-tex neochord placement x12 and Sorin Memo Ring Annuloplasty 4DM-32, size 32 via right mini thoracotomy approach  . Simple endometrial hyperplasia without atypia   . Sleep apnea     Past Surgical History:  Procedure Laterality Date  . APPENDECTOMY    . LAPAROSCOPIC CHOLECYSTECTOMY  03/2002   . MITRAL VALVE REPAIR Right 03/01/2018   Procedure: MINIMALLY INVASIVE MITRAL VALVE REPAIR (MVR);  Surgeon: [name], MD;  Location: [facility];  Service: Open Heart Surgery;  Laterality: Right;  Using LivaNova Memo 4D Size 32 Annuloplasty Ring  . RIGHT/LEFT HEART CATH AND CORONARY ANGIOGRAPHY N/A 02/20/2018   Procedure: RIGHT/LEFT HEART CATH AND CORONARY ANGIOGRAPHY;  Surgeon: Sherren Mocha, MD;  Location: [facility];  Service: Cardiovascular;  Laterality: N/A;  . TEE WITHOUT CARDIOVERSION N/A 12/09/2017   Procedure: TRANSESOPHAGEAL ECHOCARDIOGRAM (TEE);  Surgeon: Lelon Perla, MD;  Location: [facility] END[facility];  Service: Cardiovascular;  Laterality: N/A;  . TEE WITHOUT CARDIOVERSION N/A 03/01/2018   Procedure: TRANSESOPHAGEAL ECHOCARDIOGRAM (TEE);  Surgeon: [name], MD;  Location: [facility];  Service: Open Heart Surgery;  Laterality: N/A;  . TONSILLECTOMY AND ADENOIDECTOMY      Social History   Socioeconomic History  . Marital status: Married    Spouse name: Not on file  . Number of children: 2  . Years of education: Not on file  . Highest education level: Not on file  Occupational History  . Not on file  Social Needs  . Financial resource strain: Not on file  . Food insecurity:    Worry: Not on file    Inability: Not on file  . Transportation needs:    Medical: Not on file    Non-medical: Not on file  Tobacco Use  . Smoking status: Never Smoker  . Smokeless tobacco: Never Used  Substance and Sexual Activity  . Alcohol use: No    Frequency: Never  . Drug use: No  . Sexual activity: Not on file  Lifestyle  . Physical activity:    Days per week: Not on file    Minutes per session: Not on file  . Stress: Not on file  Relationships  . Social connections:    Talks on phone: Not on file    Gets together: Not on file    Attends religious service: Not on file    Active member of club or organization: Not on file    Attends meetings of clubs or organizations:  Not on file    Relationship status: Not on file  . Intimate partner violence:    Fear of current or ex partner: Not on file    Emotionally abused: Not on file    Physically abused: Not on file    Forced sexual activity: Not on file  Other Topics Concern  . Not on file  Social History Narrative  . Not on file    Family History  Problem Relation Age of Onset  . Diabetes Father   . Heart attack Father   . Osteoporosis Mother   . Diabetes Brother   . Heart failure Brother   . Diabetes Paternal  Grandmother     ROS: no fevers or chills, productive cough, hemoptysis, dysphasia, odynophagia, melena, hematochezia, dysuria, hematuria, rash, seizure activity, orthopnea, PND, pedal edema, claudication. Remaining systems are negative.  Physical Exam: Well-developed well-nourished in no acute distress.  Skin is warm and dry.  HEENT is normal.  Neck is supple.  Chest is clear to auscultation with normal expansion.  Incision without signs of infection. Cardiovascular exam is regular rate and rhythm.  Abdominal exam nontender or distended. No masses palpated. Extremities show no edema. neuro grossly intact  ECG-normal sinus rhythm with no ST changes.  Personally reviewed  A/P  1 status post mitral valve repair-patient doing well following recent surgery.  She is participating in cardiac rehabilitation.  We discussed SBE prophylaxis and I provided a card today.  We will arrange a baseline echocardiogram as well.  2 postoperative atrial fibrillation-patient remains in sinus rhythm today.  Plan to continue amiodarone for 2 weeks and then discontinue.  She continues on Coumadin which can likely be discontinued after 3 months.  3 PVCs/nonsustained ventricular tachycardia-noted on previous monitor.  Patient denies palpitations.  Question if previous mitral valve prolapse/tension on papillary muscle was contributing.  We will continue off beta-blockade.  Kirk Ruths, MD

## 2018-04-24 NOTE — Addendum Note (Signed)
Addended by: Patria Mane A on: 04/24/2018 12:55 PM   Modules accepted: Orders

## 2018-04-24 NOTE — Addendum Note (Signed)
Addended by: Patria Mane A on: 04/24/2018 12:45 PM   Modules accepted: Orders

## 2018-04-26 ENCOUNTER — Ambulatory Visit (HOSPITAL_BASED_OUTPATIENT_CLINIC_OR_DEPARTMENT_OTHER): Admission: RE | Admit: 2018-04-26 | Payer: Medicare Other | Source: Ambulatory Visit

## 2018-05-04 ENCOUNTER — Ambulatory Visit (INDEPENDENT_AMBULATORY_CARE_PROVIDER_SITE_OTHER): Payer: Medicare Other | Admitting: Pharmacist

## 2018-05-04 DIAGNOSIS — Z5181 Encounter for therapeutic drug level monitoring: Secondary | ICD-10-CM | POA: Diagnosis not present

## 2018-05-04 DIAGNOSIS — I4891 Unspecified atrial fibrillation: Secondary | ICD-10-CM

## 2018-05-04 DIAGNOSIS — Z9889 Other specified postprocedural states: Secondary | ICD-10-CM | POA: Diagnosis not present

## 2018-05-04 DIAGNOSIS — I341 Nonrheumatic mitral (valve) prolapse: Secondary | ICD-10-CM

## 2018-05-04 LAB — POCT INR: INR: 4.1 — AB (ref 2.0–3.0)

## 2018-05-11 ENCOUNTER — Ambulatory Visit (HOSPITAL_BASED_OUTPATIENT_CLINIC_OR_DEPARTMENT_OTHER)
Admission: RE | Admit: 2018-05-11 | Discharge: 2018-05-11 | Disposition: A | Discharge status: Home / Self Care | Payer: Medicare Other | Source: Ambulatory Visit | Attending: Cardiology | Admitting: Cardiology

## 2018-05-11 DIAGNOSIS — I341 Nonrheumatic mitral (valve) prolapse: Secondary | ICD-10-CM | POA: Insufficient documentation

## 2018-05-11 DIAGNOSIS — Z9889 Other specified postprocedural states: Secondary | ICD-10-CM | POA: Insufficient documentation

## 2018-05-11 NOTE — Progress Notes (Signed)
  Echocardiogram 2D Echocardiogram has been performed.  Tammy Montoya Tammy Montoya 05/11/2018, 3:45 PM

## 2018-06-06 ENCOUNTER — Ambulatory Visit (INDEPENDENT_AMBULATORY_CARE_PROVIDER_SITE_OTHER): Payer: Medicare Other | Admitting: Pharmacist Clinician (PhC)/ Clinical Pharmacy Specialist

## 2018-06-06 DIAGNOSIS — I480 Paroxysmal atrial fibrillation: Secondary | ICD-10-CM

## 2018-06-06 DIAGNOSIS — Z5181 Encounter for therapeutic drug level monitoring: Secondary | ICD-10-CM | POA: Diagnosis not present

## 2018-06-06 DIAGNOSIS — I341 Nonrheumatic mitral (valve) prolapse: Secondary | ICD-10-CM | POA: Diagnosis not present

## 2018-06-06 DIAGNOSIS — Z9889 Other specified postprocedural states: Secondary | ICD-10-CM | POA: Diagnosis not present

## 2018-06-06 DIAGNOSIS — I4891 Unspecified atrial fibrillation: Secondary | ICD-10-CM

## 2018-06-06 LAB — POCT INR: INR: 1.3 — AB (ref 2.0–3.0)

## 2018-06-06 NOTE — Patient Instructions (Signed)
Description   Take 2 tablets Tuesday Aug 20 and Wednesday Aug 21.  Then restart with 1.5 tablets daily.  Repeat INR in 2 weeks

## 2018-06-20 ENCOUNTER — Ambulatory Visit (INDEPENDENT_AMBULATORY_CARE_PROVIDER_SITE_OTHER): Payer: Medicare Other | Admitting: Pharmacist Clinician (PhC)/ Clinical Pharmacy Specialist

## 2018-06-20 ENCOUNTER — Other Ambulatory Visit: Payer: Self-pay | Admitting: Pharmacist Clinician (PhC)/ Clinical Pharmacy Specialist

## 2018-06-20 DIAGNOSIS — I341 Nonrheumatic mitral (valve) prolapse: Secondary | ICD-10-CM | POA: Diagnosis not present

## 2018-06-20 DIAGNOSIS — Z7901 Long term (current) use of anticoagulants: Secondary | ICD-10-CM

## 2018-06-20 DIAGNOSIS — Z9889 Other specified postprocedural states: Secondary | ICD-10-CM

## 2018-06-20 DIAGNOSIS — Z5181 Encounter for therapeutic drug level monitoring: Secondary | ICD-10-CM | POA: Diagnosis not present

## 2018-06-20 DIAGNOSIS — I4891 Unspecified atrial fibrillation: Secondary | ICD-10-CM | POA: Diagnosis not present

## 2018-06-20 LAB — POCT INR: INR: 2.1 (ref 2.0–3.0)

## 2018-06-20 MED ORDER — WARFARIN SODIUM 5 MG PO TABS: ORAL_TABLET | ORAL | 1 refills | Status: DC

## 2018-07-14 ENCOUNTER — Telehealth: Payer: Self-pay | Admitting: Cardiology

## 2018-07-14 DIAGNOSIS — I4891 Unspecified atrial fibrillation: Secondary | ICD-10-CM

## 2018-07-14 NOTE — Telephone Encounter (Signed)
Ok for CBC and TSH Kirk Ruths

## 2018-07-14 NOTE — Telephone Encounter (Signed)
Spoke with pt, Aware of dr crenshaw's recommendations.  Order placed 

## 2018-07-14 NOTE — Telephone Encounter (Signed)
New Message:    Pt says she is doing Cardiac Rehab and in the evening she feels real tired. The Cardiac Rehab nurse told her to call you and ask if she could have a CBC  Iron Study Panel and Thyroid. She will be her here Tuesday for a Coumadin check,she would like to have this lab work the same day please.

## 2018-07-18 ENCOUNTER — Ambulatory Visit (INDEPENDENT_AMBULATORY_CARE_PROVIDER_SITE_OTHER): Payer: Medicare Other | Admitting: Pharmacist

## 2018-07-18 DIAGNOSIS — I341 Nonrheumatic mitral (valve) prolapse: Secondary | ICD-10-CM | POA: Diagnosis not present

## 2018-07-18 DIAGNOSIS — I4891 Unspecified atrial fibrillation: Secondary | ICD-10-CM

## 2018-07-18 DIAGNOSIS — Z9889 Other specified postprocedural states: Secondary | ICD-10-CM

## 2018-07-18 DIAGNOSIS — Z5181 Encounter for therapeutic drug level monitoring: Secondary | ICD-10-CM | POA: Diagnosis not present

## 2018-07-18 LAB — POCT INR: INR: 1.4 — AB (ref 2.0–3.0)

## 2018-07-19 LAB — CBC
HEMOGLOBIN: 12.6 g/dL (ref 11.1–15.9)
Hematocrit: 38.5 % (ref 34.0–46.6)
MCH: 29.2 pg (ref 26.6–33.0)
MCHC: 32.7 g/dL (ref 31.5–35.7)
MCV: 89 fL (ref 79–97)
Platelets: 207 10*3/uL (ref 150–450)
RBC: 4.32 x10E6/uL (ref 3.77–5.28)
RDW: 14.8 % (ref 12.3–15.4)
WBC: 7.3 10*3/uL (ref 3.4–10.8)

## 2018-07-19 LAB — BASIC METABOLIC PANEL
BUN / CREAT RATIO: 17 (ref 12–28)
BUN: 17 mg/dL (ref 8–27)
CHLORIDE: 101 mmol/L (ref 96–106)
CO2: 23 mmol/L (ref 20–29)
Calcium: 9.4 mg/dL (ref 8.7–10.3)
Creatinine, Ser: 0.98 mg/dL (ref 0.57–1.00)
GFR calc Af Amer: 70 mL/min/{1.73_m2} (ref >59)
GFR calc non Af Amer: 60 mL/min/{1.73_m2} (ref >59)
GLUCOSE: 81 mg/dL (ref 65–99)
POTASSIUM: 4.7 mmol/L (ref 3.5–5.2)
Sodium: 140 mmol/L (ref 134–144)

## 2018-07-19 LAB — TSH: TSH: 1.18 u[IU]/mL (ref 0.450–4.500)

## 2018-07-20 ENCOUNTER — Encounter: Payer: Self-pay | Admitting: *Deleted

## 2018-07-24 NOTE — Progress Notes (Deleted)
HPI: FUPVCs, MVP/MV repair.Patient had a sleep study in June2018.She was noted to have PVCs. Echocardiogram January 2019 showed normal LV systolic function, prolapse of posterior MV leafletwith moderate to severe mitral regurgitation and moderate to severe left atrial enlargement by report (I have reviewed personally and there is severe prolapse of post leaflet with probable severe MR). Holter monitor January 2019 showed sinus rhythm with PACs, brief PAT, PVCs, rare couplet and nonsustained ventricular tachycardia.TEE 2/19 showed normal LV function, severe prolapse of P2 with severe MR, moderate to severe LAE. Cardiac catheterization May 2019 showed normal coronary arteries and findings consistent with severe mitral regurgitation.  Carotid Dopplers May 2019 showed no significant stenosis.  Patient underwent mitral valve repair on Mar 01, 2018.  She did develop postoperative atrial fibrillation treated with amiodarone.  TSH May 2019 normal.  Echocardiogram July 2019 showed normal LV function, mild biatrial enlargement, prior mitral valve repair with mean gradient 3 mmHg and no mitral regurgitation.  Since last seen   Current Outpatient Medications  Medication Sig Dispense Refill  . amiodarone (PACERONE) 200 MG tablet Take 1 tablet (200 mg total) by mouth daily. Discontinue 7/22 14 tablet 0  . aspirin EC 81 MG EC tablet Take 1 tablet (81 mg total) by mouth daily.    Marland Kitchen azelastine (ASTELIN) 0.1 % nasal spray Place 1 spray into both nostrils daily as needed for allergies.     . cetirizine (ZYRTEC) 10 MG tablet Take 10 mg by mouth daily.    . Cholecalciferol (VITAMIN D) 2000 UNITS CAPS Take 2,000 Units by mouth daily.     . cyanocobalamin (,VITAMIN B-12,) 1000 MCG/ML injection Inject 1,000 mcg into the muscle 3 (three) times a week.    . esomeprazole (NEXIUM) 40 MG capsule Take 40 mg by mouth as needed.     . estazolam (PROSOM) 2 MG tablet Take 1-2 mg by mouth at bedtime as needed (for  sleep).     Marland Kitchen estradiol (CLIMARA - DOSED IN MG/24 HR) 0.05 mg/24hr patch Place 1 patch (0.05 mg total) onto the skin once a week. (Patient taking differently: Place 0.05 mg onto the skin every Sunday. ) 4 patch 1  . ketotifen (ZADITOR) 0.025 % ophthalmic solution Place 1 drop into both eyes 2 (two) times daily as needed (for dry eyes).    Marland Kitchen levothyroxine (SYNTHROID, LEVOTHROID) 88 MCG tablet Take 100 mcg by mouth daily before breakfast.     . liothyronine (CYTOMEL) 5 MCG tablet Take 15 mcg by mouth daily.     . Multiple Vitamins-Minerals (MULTIVITAL PO) Take 1 tablet by mouth daily.     . ondansetron (ZOFRAN) 4 MG tablet Take 1 tablet (4 mg total) by mouth every 8 (eight) hours as needed for nausea or vomiting. (Patient not taking: Reported on 04/24/2018) 20 tablet 0  . progesterone (PROMETRIUM) 200 MG capsule Take 1 capsule (200 mg total) by mouth daily. 90 capsule 0  . sodium chloride (OCEAN) 0.65 % SOLN nasal spray Place 1-2 sprays into both nostrils as needed for congestion.    . traMADol (ULTRAM) 50 MG tablet Take 1-2 tablets (50-100 mg total) by mouth every 6 (six) hours as needed for moderate pain. (Patient not taking: Reported on 04/24/2018) 30 tablet 0  . warfarin (COUMADIN) 5 MG tablet Take 1.5 tablets by mouth daily or as directed by coumadin clinic 135 tablet 1   No current facility-administered medications for this visit.      Past Medical History:  Diagnosis  Date  . Basal cell carcinoma of right lower leg   . Dyspnea    all the time  . Fibrocystic disease of breast   . Hypothyroid   . IBS (irritable bowel syndrome)   . Mitral regurgitation   . MVP (mitral valve prolapse)    Murmur  . Pernicious anemia   . Rheumatic fever    as child  . S/P minimally invasive mitral valve repair 03/01/2018   Complex valvuloplasty including artificial Gore-tex neochord placement x12 and Sorin Memo Ring Annuloplasty 4DM-32, size 32 via right mini thoracotomy approach  . Simple endometrial  hyperplasia without atypia   . Sleep apnea     Past Surgical History:  Procedure Laterality Date  . APPENDECTOMY    . LAPAROSCOPIC CHOLECYSTECTOMY  03/2002  . MITRAL VALVE REPAIR Right 03/01/2018   Procedure: MINIMALLY INVASIVE MITRAL VALVE REPAIR (MVR);  Surgeon: [name], MD;  Location: [facility];  Service: Open Heart Surgery;  Laterality: Right;  Using LivaNova Memo 4D Size 32 Annuloplasty Ring  . RIGHT/LEFT HEART CATH AND CORONARY ANGIOGRAPHY N/A 02/20/2018   Procedure: RIGHT/LEFT HEART CATH AND CORONARY ANGIOGRAPHY;  Surgeon: Sherren Mocha, MD;  Location: [facility];  Service: Cardiovascular;  Laterality: N/A;  . TEE WITHOUT CARDIOVERSION N/A 12/09/2017   Procedure: TRANSESOPHAGEAL ECHOCARDIOGRAM (TEE);  Surgeon: Lelon Perla, MD;  [facility];  Service: Cardiovascular;  Laterality: N/A;  . TEE WITHOUT CARDIOVERSION N/A 03/01/2018   Procedure: TRANSESOPHAGEAL ECHOCARDIOGRAM (TEE);  Surgeon: [name], MD;  Location: [facility];  Service: Open Heart Surgery;  Laterality: N/A;  . TONSILLECTOMY AND ADENOIDECTOMY      Social History   Socioeconomic History  . Marital status: Married    Spouse name: Not on file  . Number of children: 2  . Years of education: Not on file  . Highest education level: Not on file  Occupational History  . Not on file  Social Needs  . Financial resource strain: Not on file  . Food insecurity:    Worry: Not on file    Inability: Not on file  . Transportation needs:    Medical: Not on file    Non-medical: Not on file  Tobacco Use  . Smoking status: Never Smoker  . Smokeless tobacco: Never Used  Substance and Sexual Activity  . Alcohol use: No    Frequency: Never  . Drug use: No  . Sexual activity: Not on file  Lifestyle  . Physical activity:    Days per week: Not on file    Minutes per session: Not on file  . Stress: Not on file  Relationships  . Social connections:    Talks on phone: Not on file    Gets  together: Not on file    Attends religious service: Not on file    Active member of club or organization: Not on file    Attends meetings of clubs or organizations: Not on file    Relationship status: Not on file  . Intimate partner violence:    Fear of current or ex partner: Not on file    Emotionally abused: Not on file    Physically abused: Not on file    Forced sexual activity: Not on file  Other Topics Concern  . Not on file  Social History Narrative  . Not on file    Family History  Problem Relation Age of Onset  . Diabetes Father   . Heart attack Father   .  Osteoporosis Mother   . Diabetes Brother   . Heart failure Brother   . Diabetes Paternal Grandmother     ROS: no fevers or chills, productive cough, hemoptysis, dysphasia, odynophagia, melena, hematochezia, dysuria, hematuria, rash, seizure activity, orthopnea, PND, pedal edema, claudication. Remaining systems are negative.  Physical Exam: Well-developed well-nourished in no acute distress.  Skin is warm and dry.  HEENT is normal.  Neck is supple.  Chest is clear to auscultation with normal expansion.  Cardiovascular exam is regular rate and rhythm.  Abdominal exam nontender or distended. No masses palpated. Extremities show no edema. neuro grossly intact  ECG- personally reviewed  A/P  1  Kirk Ruths, MD

## 2018-07-28 ENCOUNTER — Ambulatory Visit (INDEPENDENT_AMBULATORY_CARE_PROVIDER_SITE_OTHER): Payer: Medicare Other | Admitting: Pharmacist Clinician (PhC)/ Clinical Pharmacy Specialist

## 2018-07-28 DIAGNOSIS — Z5181 Encounter for therapeutic drug level monitoring: Secondary | ICD-10-CM | POA: Diagnosis not present

## 2018-07-28 DIAGNOSIS — I341 Nonrheumatic mitral (valve) prolapse: Secondary | ICD-10-CM | POA: Diagnosis not present

## 2018-07-28 DIAGNOSIS — I4891 Unspecified atrial fibrillation: Secondary | ICD-10-CM

## 2018-07-28 DIAGNOSIS — Z9889 Other specified postprocedural states: Secondary | ICD-10-CM | POA: Diagnosis not present

## 2018-07-28 LAB — POCT INR: INR: 1.5 — AB (ref 2.0–3.0)

## 2018-08-07 ENCOUNTER — Ambulatory Visit: Payer: Medicare Other | Admitting: Cardiology

## 2018-08-07 ENCOUNTER — Ambulatory Visit: Payer: Medicare Other | Admitting: Thoracic Surgery (Cardiothoracic Vascular Surgery)

## 2018-08-16 ENCOUNTER — Ambulatory Visit (INDEPENDENT_AMBULATORY_CARE_PROVIDER_SITE_OTHER): Payer: Medicare Other | Admitting: Pharmacist Clinician (PhC)/ Clinical Pharmacy Specialist

## 2018-08-16 DIAGNOSIS — Z5181 Encounter for therapeutic drug level monitoring: Secondary | ICD-10-CM | POA: Diagnosis not present

## 2018-08-16 DIAGNOSIS — Z9889 Other specified postprocedural states: Secondary | ICD-10-CM | POA: Diagnosis not present

## 2018-08-16 DIAGNOSIS — I341 Nonrheumatic mitral (valve) prolapse: Secondary | ICD-10-CM

## 2018-08-16 DIAGNOSIS — I4891 Unspecified atrial fibrillation: Secondary | ICD-10-CM

## 2018-08-16 LAB — POCT INR: INR: 1.5 — AB (ref 2.0–3.0)

## 2018-08-16 NOTE — Patient Instructions (Signed)
Description   Increase dose to 2 tablets daily except for 1.5 tablets every Sunday, Tuesday and Thursday .  Repeat INR in 2 weeks

## 2018-08-21 ENCOUNTER — Other Ambulatory Visit: Payer: Self-pay

## 2018-08-21 ENCOUNTER — Encounter: Payer: Self-pay | Admitting: Thoracic Surgery (Cardiothoracic Vascular Surgery)

## 2018-08-21 ENCOUNTER — Ambulatory Visit: Payer: Medicare Other | Admitting: Thoracic Surgery (Cardiothoracic Vascular Surgery)

## 2018-08-21 VITALS — BP 118/72 | HR 77 | Resp 18 | Ht 69.0 in | Wt 158.8 lb

## 2018-08-21 DIAGNOSIS — Z9889 Other specified postprocedural states: Secondary | ICD-10-CM | POA: Diagnosis not present

## 2018-08-21 NOTE — Patient Instructions (Signed)
You may stop taking warfarin if okay with Dr. Stanford Breed.  Continue all other medications without any changes at this time  You may resume unrestricted physical activity without any particular limitations at this time.  Endocarditis is a potentially serious infection of heart valves or inside lining of the heart.  It occurs more commonly in patients with diseased heart valves (such as patient's with aortic or mitral valve disease) and in patients who have undergone heart valve repair or replacement.  Certain surgical and dental procedures may put you at risk, such as dental cleaning, other dental procedures, or any surgery involving the respiratory, urinary, gastrointestinal tract, gallbladder or prostate gland.   To minimize your chances for develooping endocarditis, maintain good oral health and seek prompt medical attention for any infections involving the mouth, teeth, gums, skin or urinary tract.    Always notify your doctor or dentist about your underlying heart valve condition before having any invasive procedures. You will need to take antibiotics before certain procedures, including all routine dental cleanings or other dental procedures.  Your cardiologist or dentist should prescribe these antibiotics for you to be taken ahead of time.

## 2018-08-21 NOTE — Progress Notes (Signed)
[address]       [place] [address]             [phone]     CARDIOTHORACIC SURGERY OFFICE NOTE  Referring Provider is Stanford Breed, Denice Bors, MD PCP is [place], [place], [place]   HPI:  Patient is a 67 year old female with history of mitral valve prolapse, rheumatic fever during childhood, frequent PVCs and palpitations, obstructive sleep apnea,and hypothyroidism who returns to the office today for routine follow-up status post minimally invasive mitral valve repair on Mar 01, 2018 for mitral valve prolapse with severe symptomatic primary mitral regurgitation.  The patient's early postoperative recovery in the hospital was uncomplicated although notable for the development of postoperative atrial fibrillation for which she was treated with amiodarone.    She was discharged home in sinus rhythm and has remained in sinus rhythm ever since.  She was last seen here in our office on April 24, 2018 at which time she was doing very well.  Amiodarone was stopped shortly after that.  She returns her office today and reports that she is doing exceptionally well.  She completed the outpatient cardiac rehab program and she continues to exercise on a regular basis.  She states that noticeably much better than it was prior to surgery and her exercise tolerance continues to improve.  Overall she is delighted with her progress.  She specifically denies any palpitations or other symptoms to suggest a recurrence of atrial fibrillation.  She currently remains anticoagulated using warfarin.   Current Outpatient Medications  Medication Sig Dispense Refill  . aspirin EC 81 MG EC tablet Take 1 tablet (81 mg total) by mouth daily.    Marland Kitchen azelastine (ASTELIN) 0.1 % nasal spray Place 1 spray into both nostrils daily as needed for allergies.     . cetirizine (ZYRTEC) 10 MG tablet Take 10 mg by mouth daily.    . Cholecalciferol (VITAMIN D) 2000 UNITS CAPS Take 2,000 Units by mouth daily.     .  cyanocobalamin (,VITAMIN B-12,) 1000 MCG/ML injection Inject 1,000 mcg into the muscle 3 (three) times a week.    . estazolam (PROSOM) 2 MG tablet Take 1-2 mg by mouth at bedtime as needed (for sleep).     Marland Kitchen estradiol (CLIMARA - DOSED IN MG/24 HR) 0.05 mg/24hr patch Place 1 patch (0.05 mg total) onto the skin once a week. (Patient taking differently: Place 0.05 mg onto the skin every Sunday. ) 4 patch 1  . ketotifen (ZADITOR) 0.025 % ophthalmic solution Place 1 drop into both eyes 2 (two) times daily as needed (for dry eyes).    Marland Kitchen levothyroxine (SYNTHROID, LEVOTHROID) 88 MCG tablet Take 100 mcg by mouth daily before breakfast.     . Multiple Vitamins-Minerals (MULTIVITAL PO) Take 1 tablet by mouth daily.     . progesterone (PROMETRIUM) 200 MG capsule Take 1 capsule (200 mg total) by mouth daily. 90 capsule 0  . sodium chloride (OCEAN) 0.65 % SOLN nasal spray Place 1-2 sprays into both nostrils as needed for congestion.    . traMADol (ULTRAM) 50 MG tablet Take 1-2 tablets (50-100 mg total) by mouth every 6 (six) hours as needed for moderate pain. 30 tablet 0  . warfarin (COUMADIN) 5 MG tablet Take 1.5 tablets by mouth daily or as directed by coumadin clinic (Patient taking differently: Take 1.5 tablets by mouth daily or as directed by coumadin clinic. Tues, Wed, Sat takes 2 tablets) 135 tablet 1  No current facility-administered medications for this visit.       Physical Exam:   BP 118/72 (BP Location: Right Arm, Patient Position: Sitting, Cuff Size: Normal)   Pulse 77   Resp 18   Ht 5\' 9"  (1.753 m)   Wt 158 lb 12.8 oz (72 kg)   LMP 09/17/2012   SpO2 98% Comment: RA  BMI 23.45 kg/m   General:  Well-appearing  Chest:   Clear to auscultation  CV:   Regular rate and rhythm without murmur  Incisions:  Completely healed  Abdomen:  Soft nontender  Extremities:  Warm and well-perfused, no edema  Diagnostic Tests:  Transthoracic Echocardiography  Patient:    Kaavya, Puskarich MR #:        [id number] Study Date: 05/11/2018 Gender:     F Age:        43 Height:     175.3 cm Weight:     71.2 kg BSA:        1.87 m^2 Pt. Status: Room:   [place]  [employer] [place]  SONOGRAPHER  Cardell Peach, RDCS  cc:  ------------------------------------------------------------------- LV EF: 55% -   60%  ------------------------------------------------------------------- History:   PMH:  MVP (mitral valve prolapse)  S/P mitral valve repair.  Mitral valve disease.  Mitral valve prolapse.  ------------------------------------------------------------------- Study Conclusions  - Left ventricle: The cavity size was normal. Systolic function was   normal. The estimated ejection fraction was in the range of 55%   to 60%. Left ventricular diastolic function parameters were   normal. - Mitral valve: Mildly to moderately thickened annulus. Valve area   by pressure half-time: 2.47 cm^2. Valve area by continuity   equation (using LVOT flow): 1.65 cm^2. - Left atrium: The atrium was mildly dilated. - Right atrium: The atrium was mildly dilated.  Impressions:  - Normal LVEF.   S/P MV repair - no significant stenosis ( Peak / mean gradient   6/3 mmHg, MVA 2.89 cm2). NO MR.   Mild biatrial enlargement.   Normal pulmonary pressure.  ------------------------------------------------------------------- Study data:  Comparison was made to the study of 03/01/2018.  Study status:  Routine.  Procedure:  Transthoracic echocardiography. Image quality was adequate.          Transthoracic echocardiography.  M-mode, complete 2D, spectral Doppler, and color Doppler.  Birthdate:  Patient birthdate: 01/01/1951.  Age:  Patient is 67 yr old.  Sex:  Gender: female.    BMI: 23.2 kg/m^2.  Blood pressure:     125/78  Patient status:  Outpatient.  Study date: Study date: 05/11/2018. Study time:  03:20 PM.  Location:  [facility].  -------------------------------------------------------------------  ------------------------------------------------------------------- Left ventricle:  The cavity size was normal. Systolic function was normal. The estimated ejection fraction was in the range of 55% to 60%. The transmitral flow pattern was normal. The deceleration time of the early transmitral flow velocity was normal. The pulmonary vein flow pattern was normal. The tissue Doppler parameters were normal. Left ventricular diastolic function parameters were normal.   ------------------------------------------------------------------- Aortic valve:   Structurally normal valve.   Cusp separation was normal.  Doppler:  Transvalvular velocity was within the normal range. There was no stenosis. There was no regurgitation.  ------------------------------------------------------------------- Mitral valve:   Mildly to moderately thickened annulus.  Doppler: There was no evidence for stenosis.   There was no regurgitation.  Valve area by pressure half-time: 2.47 cm^2. Indexed valve area by pressure half-time: 1.32 cm^2/m^2. Valve area by continuity equation (using LVOT flow): 1.65 cm^2. Indexed valve area by continuity equation (using LVOT flow): 0.88 cm^2/m^2.    Mean gradient (D): 4 mm Hg. Peak gradient (D): 8 mm Hg.  ------------------------------------------------------------------- Left atrium:  The atrium was mildly dilated.  ------------------------------------------------------------------- Right ventricle:  The cavity size was normal. Wall thickness was normal. Systolic function was normal.  ------------------------------------------------------------------- Pulmonic valve:    Structurally normal valve.   Cusp separation was normal.  Doppler:  Transvalvular velocity was within the normal range. There was no  regurgitation.  ------------------------------------------------------------------- Tricuspid valve:   Structurally normal valve.   Leaflet separation was normal.  Doppler:  Transvalvular velocity was within the normal range. There was trivial regurgitation.  ------------------------------------------------------------------- Right atrium:  The atrium was mildly dilated.  ------------------------------------------------------------------- Pericardium:  The pericardium was normal in appearance.  ------------------------------------------------------------------- Measurements   Left ventricle                           Value          Reference  LV ID, ED, PLAX chordal                  46    mm       43 - 52  LV ID, ES, PLAX chordal                  26.2  mm       23 - 38  LV fx shortening, PLAX chordal           43    %        >=29  LV PW thickness, ED                      10.5  mm       ----------  IVS/LV PW ratio, ED                      0.82           <=1.3  Stroke volume, 2D                        68    ml       ----------  Stroke volume/bsa, 2D                    36    ml/m^2   ----------  LV e&', lateral                           11.6  cm/s     ----------  LV E/e&', lateral                         11.9           ----------  LV e&', medial                            7.62  cm/s     ----------  LV E/e&', medial                          18.11          ----------  LV e&', average                           9.61  cm/s     ----------  LV E/e&', average                         14.36          ----------    Ventricular septum                       Value          Reference  IVS thickness, ED                        8.63  mm       ----------    LVOT                                     Value          Reference  LVOT ID, S                               20    mm       ----------  LVOT area                                3.14  cm^2     ----------  LVOT peak velocity, S                     115   cm/s     ----------  LVOT mean velocity, S                    75.4  cm/s     ----------  LVOT VTI, S                              21.7  cm       ----------  LVOT peak gradient, S                    5     mm Hg    ----------    Aorta                                    Value          Reference  Aortic root ID, ED                       28    mm       ----------    Left atrium                              Value          Reference  LA ID, A-P, ES                           33  mm       ----------  LA ID/bsa, A-P                           1.77  cm/m^2   <=2.2  LA volume, S                             53.8  ml       ----------  LA volume/bsa, S                         28.8  ml/m^2   ----------  LA volume, ES, 1-p A4C                   50.9  ml       ----------  LA volume/bsa, ES, 1-p A4C               27.3  ml/m^2   ----------  LA volume, ES, 1-p A2C                   55.1  ml       ----------  LA volume/bsa, ES, 1-p A2C               29.5  ml/m^2   ----------    Mitral valve                             Value          Reference  Mitral E-wave peak velocity              138   cm/s     ----------  Mitral A-wave peak velocity              115   cm/s     ----------  Mitral mean velocity, D                  83.1  cm/s     ----------  Mitral deceleration time         (H)     310   ms       150 - 230  Mitral pressure half-time                102   ms       ----------  Mitral mean gradient, D                  4     mm Hg    ----------  Mitral peak gradient, D                  8     mm Hg    ----------  Mitral E/A ratio, peak                   1.2            ----------  Mitral valve area, PHT, DP               2.47  cm^2     ----------  Mitral valve area/bsa, PHT, DP           1.32  cm^2/m^2 ----------  Mitral valve area, LVOT  1.65  cm^2     ----------  continuity  Mitral valve area/bsa, LVOT              0.88  cm^2/m^2 ----------  continuity  Mitral annulus VTI, D                     41.4  cm       ----------    Pulmonary arteries                       Value          Reference  PA pressure, S, DP                       30    mm Hg    <=30    Tricuspid valve                          Value          Reference  Tricuspid regurg peak velocity           262   cm/s     ----------  Tricuspid peak RV-RA gradient            27    mm Hg    ----------    Right atrium                             Value          Reference  RA ID, S-I, ES, A4C              (H)     50.3  mm       34 - 49  RA area, ES, A4C                         18.2  cm^2     8.3 - 19.5  RA volume, ES, A/L                       54.3  ml       ----------  RA volume/bsa, ES, A/L                   29.1  ml/m^2   ----------    Systemic veins                           Value          Reference  Estimated CVP                            3     mm Hg    ----------    Right ventricle                          Value          Reference  TAPSE                                    15.7  mm       ----------  RV pressure, S, DP  30    mm Hg    <=30  RV s&', lateral, S                        10.3  cm/s     ----------  Legend: (L)  and  (H)  mark values outside specified reference range.  ------------------------------------------------------------------- Prepared and Electronically Authenticated by  Jenne Campus, MD 2019-07-26T11:38:00   Impression:  Patient is doing very well nearly 6 months status post minimally invasive mitral valve repair.  Routine follow-up echocardiogram performed May 12, 2018 reveals normal left ventricular systolic function with intact mitral valve repair, no residual mitral regurgitation.  And no significant stenosis.  Plan:  I have instructed that the patient may stop taking warfarin as long as she checks with Dr. Stanford Breed.  I have instructed her to continue aspirin 81 mg daily indefinitely.  The patient has been reminded regarding the importance of dental hygiene  and the lifelong need for antibiotic prophylaxis for all dental cleanings and other related invasive procedures.  She will continue to follow-up regularly with Dr. Stanford Breed and return to our office for routine follow-up next May, approximately 1 year following her surgery.  I spent in excess of 15 minutes during the conduct of this office consultation and >50% of this time involved direct face-to-face encounter with the patient for counseling and/or coordination of their care.    [name], MD 08/21/2018 2:59 PM

## 2018-09-19 ENCOUNTER — Ambulatory Visit: Payer: Self-pay

## 2018-09-19 NOTE — Progress Notes (Signed)
Scheduled overdue inr in concordance with pcp appt

## 2018-10-31 NOTE — Progress Notes (Signed)
HPI: FUPVCs, MVP/MV repair.Holter monitor January 2019 showed sinus rhythm with PACs, brief PAT, PVCs, rare couplet and nonsustained ventricular tachycardia.TEE 2/19 showed normal LV function, severe prolapse of P2 with severe MR, moderate to severe LAE. Cardiac catheterization May 2019 showed normal coronary arteries and findings consistent with severe mitral regurgitation.  Carotid Dopplers May 2019 showed no significant stenosis.  Patient underwent mitral valve repair on Mar 01, 2018.  She did develop postoperative atrial fibrillation treated with amiodarone. Follow-up echocardiogram July 2019 showed normal LV function, prior mitral valve repair with mean gradient 3 mmHg, no mitral regurgitation and mild biatrial enlargement. Since last seenthe patient denies any dyspnea on exertion, orthopnea, PND, pedal edema, palpitations, syncope or chest pain.   Current Outpatient Medications  Medication Sig Dispense Refill  . aspirin EC 81 MG EC tablet Take 1 tablet (81 mg total) by mouth daily.    Marland Kitchen azelastine (ASTELIN) 0.1 % nasal spray Place 1 spray into both nostrils daily as needed for allergies.     . cetirizine (ZYRTEC) 10 MG tablet Take 10 mg by mouth daily.    . Cholecalciferol (VITAMIN D) 2000 UNITS CAPS Take 2,000 Units by mouth daily.     . cyanocobalamin (,VITAMIN B-12,) 1000 MCG/ML injection Inject 1,000 mcg into the muscle 3 (three) times a week.    . estazolam (PROSOM) 2 MG tablet Take 1-2 mg by mouth at bedtime as needed (for sleep).     Marland Kitchen estradiol (CLIMARA - DOSED IN MG/24 HR) 0.05 mg/24hr patch Place 1 patch (0.05 mg total) onto the skin once a week. (Patient taking differently: Place 0.05 mg onto the skin every Sunday. ) 4 patch 1  . ketotifen (ZADITOR) 0.025 % ophthalmic solution Place 1 drop into both eyes 2 (two) times daily as needed (for dry eyes).    Marland Kitchen levothyroxine (SYNTHROID, LEVOTHROID) 88 MCG tablet Take 100 mcg by mouth daily before breakfast.     . Multiple  Vitamins-Minerals (MULTIVITAL PO) Take 1 tablet by mouth daily.     . progesterone (PROMETRIUM) 200 MG capsule Take 1 capsule (200 mg total) by mouth daily. 90 capsule 0  . sodium chloride (OCEAN) 0.65 % SOLN nasal spray Place 1-2 sprays into both nostrils as needed for congestion.    . traMADol (ULTRAM) 50 MG tablet Take 1-2 tablets (50-100 mg total) by mouth every 6 (six) hours as needed for moderate pain. 30 tablet 0   No current facility-administered medications for this visit.      Past Medical History:  Diagnosis Date  . Basal cell carcinoma of right lower leg   . Dyspnea    all the time  . Fibrocystic disease of breast   . Hypothyroid   . IBS (irritable bowel syndrome)   . Mitral regurgitation   . MVP (mitral valve prolapse)    Murmur  . Pernicious anemia   . Rheumatic fever    as child  . S/P minimally invasive mitral valve repair 03/01/2018   Complex valvuloplasty including artificial Gore-tex neochord placement x12 and Sorin Memo Ring Annuloplasty 4DM-32, size 32 via right mini thoracotomy approach  . Simple endometrial hyperplasia without atypia   . Sleep apnea     Past Surgical History:  Procedure Laterality Date  . APPENDECTOMY    . LAPAROSCOPIC CHOLECYSTECTOMY  03/2002  . MITRAL VALVE REPAIR Right 03/01/2018   Procedure: MINIMALLY INVASIVE MITRAL VALVE REPAIR (MVR);  Surgeon: [name], MD;  Location: [facility];  Service: Open  Heart Surgery;  Laterality: Right;  Using LivaNova Memo 4D Size 32 Annuloplasty Ring  . RIGHT/LEFT HEART CATH AND CORONARY ANGIOGRAPHY N/A 02/20/2018   Procedure: RIGHT/LEFT HEART CATH AND CORONARY ANGIOGRAPHY;  Surgeon: Sherren Mocha, MD;  Location: [facility];  Service: Cardiovascular;  Laterality: N/A;  . TEE WITHOUT CARDIOVERSION N/A 12/09/2017   Procedure: TRANSESOPHAGEAL ECHOCARDIOGRAM (TEE);  Surgeon: Lelon Perla, MD;  Location: [facility];  Service: Cardiovascular;  Laterality: N/A;  . TEE WITHOUT CARDIOVERSION N/A  03/01/2018   Procedure: TRANSESOPHAGEAL ECHOCARDIOGRAM (TEE);  Surgeon: [name], MD;  Location: [facility];  Service: Open Heart Surgery;  Laterality: N/A;  . TONSILLECTOMY AND ADENOIDECTOMY      Social History   Socioeconomic History  . Marital status: Married    Spouse name: Not on file  . Number of children: 2  . Years of education: Not on file  . Highest education level: Not on file  Occupational History  . Not on file  Social Needs  . Financial resource strain: Not on file  . Food insecurity:    Worry: Not on file    Inability: Not on file  . Transportation needs:    Medical: Not on file    Non-medical: Not on file  Tobacco Use  . Smoking status: Never Smoker  . Smokeless tobacco: Never Used  Substance and Sexual Activity  . Alcohol use: No    Frequency: Never  . Drug use: No  . Sexual activity: Not on file  Lifestyle  . Physical activity:    Days per week: Not on file    Minutes per session: Not on file  . Stress: Not on file  Relationships  . Social connections:    Talks on phone: Not on file    Gets together: Not on file    Attends religious service: Not on file    Active member of club or organization: Not on file    Attends meetings of clubs or organizations: Not on file    Relationship status: Not on file  . Intimate partner violence:    Fear of current or ex partner: Not on file    Emotionally abused: Not on file    Physically abused: Not on file    Forced sexual activity: Not on file  Other Topics Concern  . Not on file  Social History Narrative  . Not on file    Family History  Problem Relation Age of Onset  . Diabetes Father   . Heart attack Father   . Osteoporosis Mother   . Diabetes Brother   . Heart failure Brother   . Diabetes Paternal Grandmother     ROS: recent URI but no hemoptysis, dysphasia, odynophagia, melena, hematochezia, dysuria, hematuria, rash, seizure activity, orthopnea, PND, pedal edema, claudication. Remaining  systems are negative.  Physical Exam: Well-developed well-nourished in no acute distress.  Skin is warm and dry.  HEENT is normal.  Neck is supple.  Chest is clear to auscultation with normal expansion.  Cardiovascular exam is regular rate and rhythm.  Abdominal exam nontender or distended. No masses palpated. Extremities show no edema. neuro grossly intact  ECG-normal sinus rhythm at a rate of 80, nonspecific ST changes.  Personally reviewed  A/P  1 status post mitral valve repair-patient is doing well from a symptomatic standpoint with no dyspnea or chest pain.  Continue SBE prophylaxis.  2 postoperative atrial fibrillation-patient remains in sinus rhythm on examination.  Amiodarone discontinued previously.  Coumadin has  also now been discontinued.  3 history of PVCs/nonsustained ventricular tachycardia-question if this was related to previous mitral valve prolapse/tensional papillary muscles.  No symptoms at present.  We can add a beta-blocker later if needed.  Kirk Ruths, MD

## 2018-11-02 ENCOUNTER — Ambulatory Visit: Payer: Medicare Other | Admitting: Cardiology

## 2018-11-02 ENCOUNTER — Encounter: Payer: Self-pay | Admitting: Cardiology

## 2018-11-02 ENCOUNTER — Ambulatory Visit: Payer: Self-pay | Admitting: Pharmacist

## 2018-11-02 VITALS — BP 102/68 | HR 80 | Ht 69.0 in | Wt 156.0 lb

## 2018-11-02 DIAGNOSIS — I341 Nonrheumatic mitral (valve) prolapse: Secondary | ICD-10-CM

## 2018-11-02 DIAGNOSIS — I480 Paroxysmal atrial fibrillation: Secondary | ICD-10-CM

## 2018-11-02 DIAGNOSIS — Z5181 Encounter for therapeutic drug level monitoring: Secondary | ICD-10-CM

## 2018-11-02 DIAGNOSIS — Z9889 Other specified postprocedural states: Secondary | ICD-10-CM

## 2018-11-02 DIAGNOSIS — I4891 Unspecified atrial fibrillation: Secondary | ICD-10-CM

## 2018-11-02 NOTE — Patient Instructions (Signed)

## 2018-11-23 ENCOUNTER — Telehealth: Payer: Self-pay

## 2018-11-23 NOTE — Telephone Encounter (Signed)
Patient contacted the office concerned about medications to take when she has a cold/ congestion/ and allergies.  She is s/p MVR 03/01/18 with Dr. Roxy Manns.  Patient stated that she was told she could not take Sudafed at her last appointment.  I advised her that she could take plain Mucinex or Coricidin HBP if needed.  Patient was unsure of the answers that she was given.  I advised her that she could also contact her Cardiologist as well.  She acknowledged receipt.

## 2019-02-07 IMAGING — DX DG CHEST 1V PORT
1 series · 1 of 1 positions shown · non-contrast
Comparison: Yesterday

CLINICAL DATA: Chest tube.  Mitral valve replacement

EXAM:
PORTABLE CHEST 1 VIEW

[chest ap]
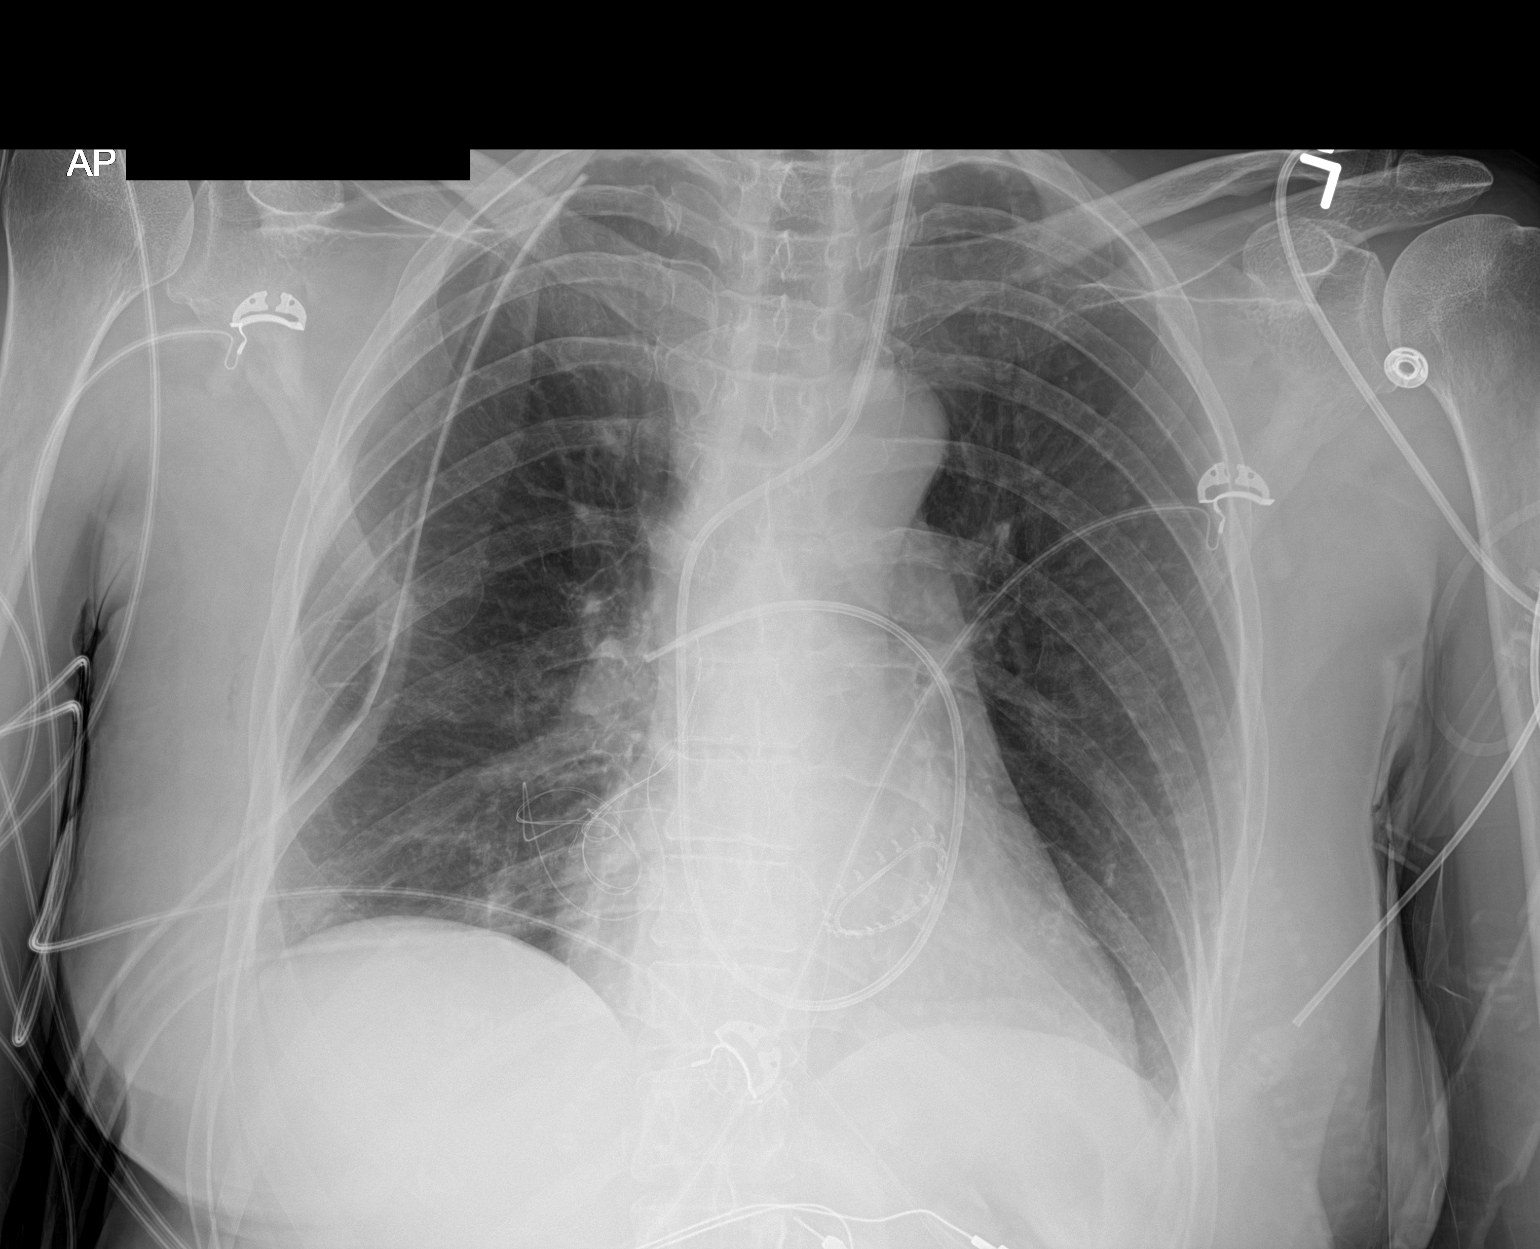

[1 of 1 positions shown; findings below may reference images not displayed]

FINDINGS: Interval tracheal and esophageal extubation. Stable Swan-Ganz
catheter position with tip at the interlobar pulmonary artery level.
Right chest tube remains. No visible pneumothorax. Stable small
right chest wall emphysema. Minor atelectasis. Stable postoperative
heart size.
IMPRESSION: Stable chest after extubation.  No visible pneumothorax.

## 2019-02-08 IMAGING — DX DG CHEST 1V PORT
1 series · 1 of 1 positions shown · non-contrast
Comparison: March 02, 2018

CLINICAL DATA: Cardiac surgery.  Chest tube.

EXAM:
PORTABLE CHEST 1 VIEW

[chest ap]
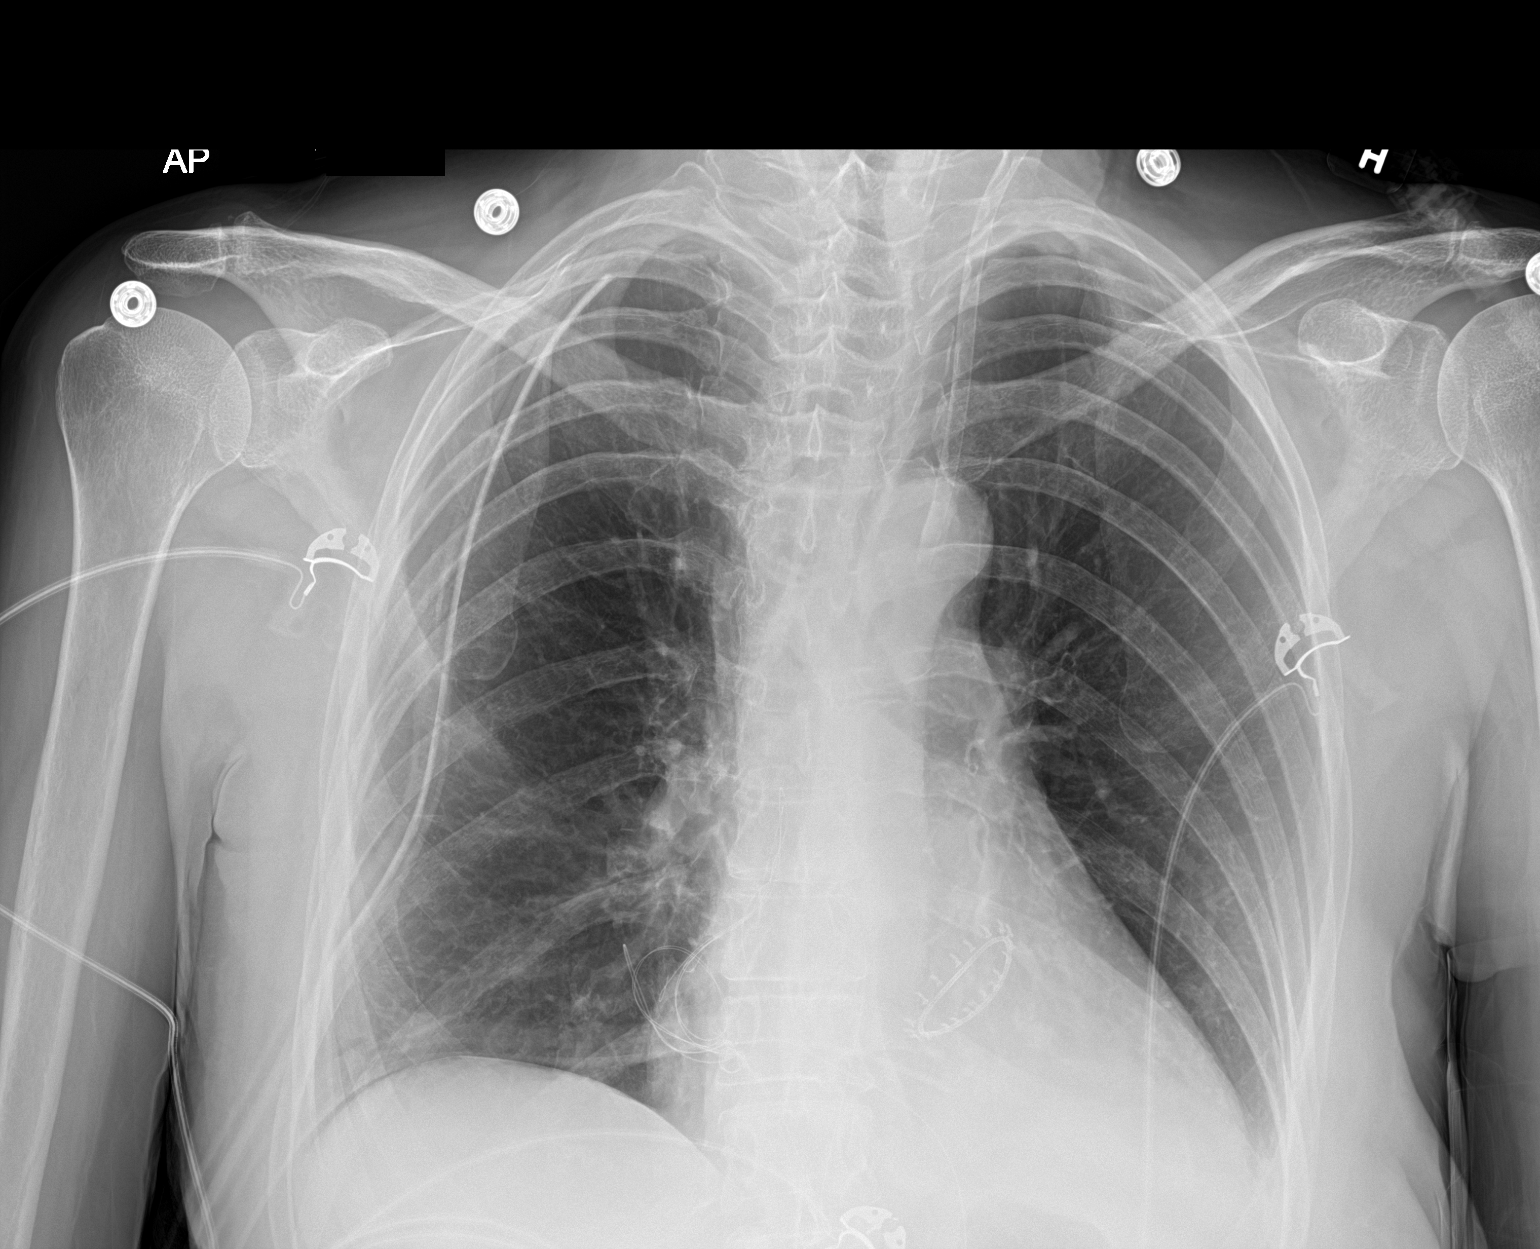

[1 of 1 positions shown; findings below may reference images not displayed]

FINDINGS: The PA catheter is been removed. The left IJ sheath remains. The
right chest tube is stable. No pneumothorax. Mild atelectasis in the
left base is more pronounced. The cardiomediastinal silhouette is
stable. No other changes.
IMPRESSION: 1. Support apparatus as above.
2. Atelectasis in the left base is mild but more pronounced in the
interval.
3. No other changes.

## 2019-02-11 IMAGING — DX DG CHEST 1V PORT
1 series · 1 of 1 positions shown · non-contrast
Comparison: March 05, 2018

CLINICAL DATA: Chest tube removal

EXAM:
PORTABLE CHEST 1 VIEW

[chest]
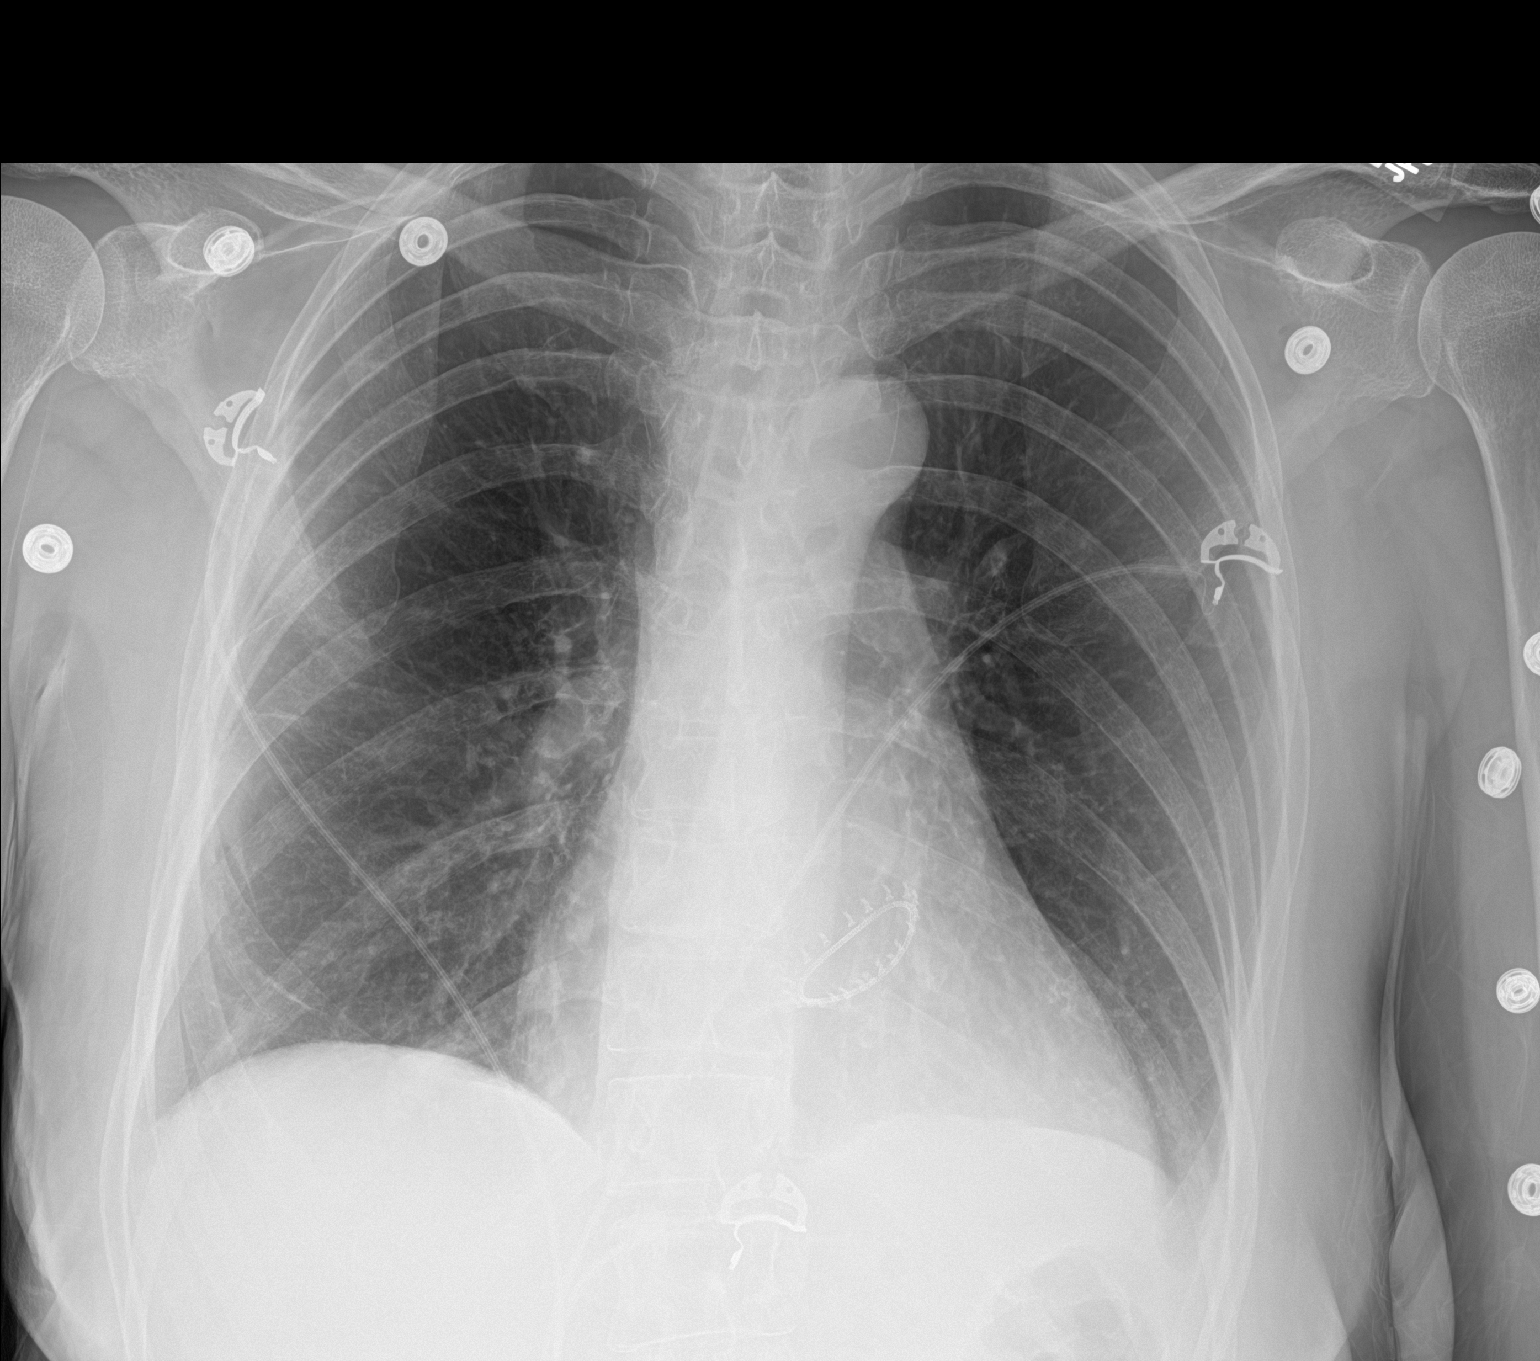

[1 of 1 positions shown; findings below may reference images not displayed]

FINDINGS: Right chest tube has been removed. Temporary pacemaker wires no
longer appreciable. No evident pneumothorax. There is no appreciable
edema or consolidation. Heart size and pulmonary vascular normal.
Status post mitral valve replacement. No adenopathy. No bone
lesions.
IMPRESSION: Chest tube and pacemaker wires no longer present. No pneumothorax.
No edema or consolidation. Stable cardiac silhouette.

## 2019-02-25 IMAGING — DX DG CHEST 2V
2 series · 2 of 2 positions shown · non-contrast
Comparison: Portable chest x-ray March 06, 2018

CLINICAL DATA: Status post mitral valve repair on March 01, 2018.

EXAM:
CHEST - 2 VIEW

[dg chest 2 view (1 of 2)]
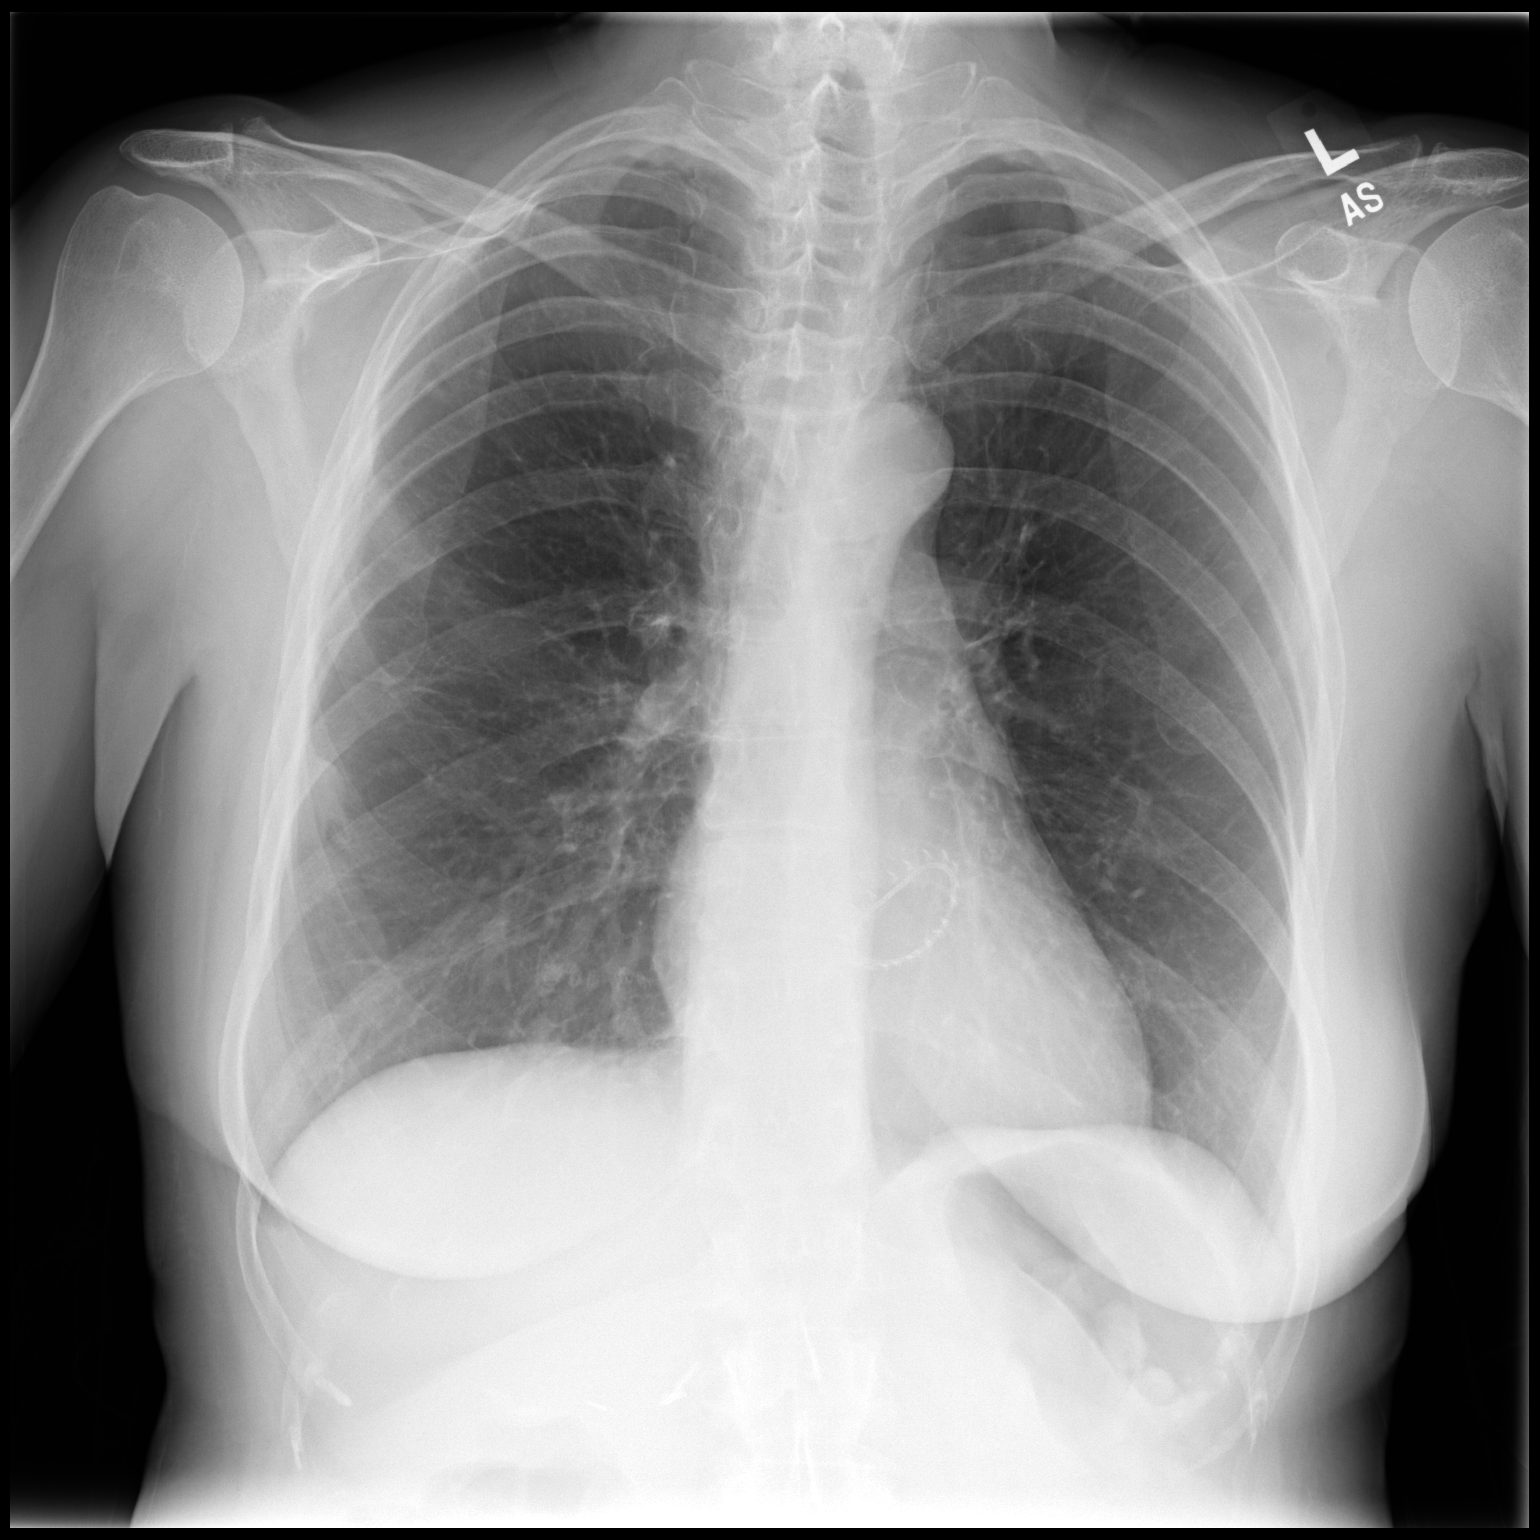

[dg chest 2 view (2 of 2)]
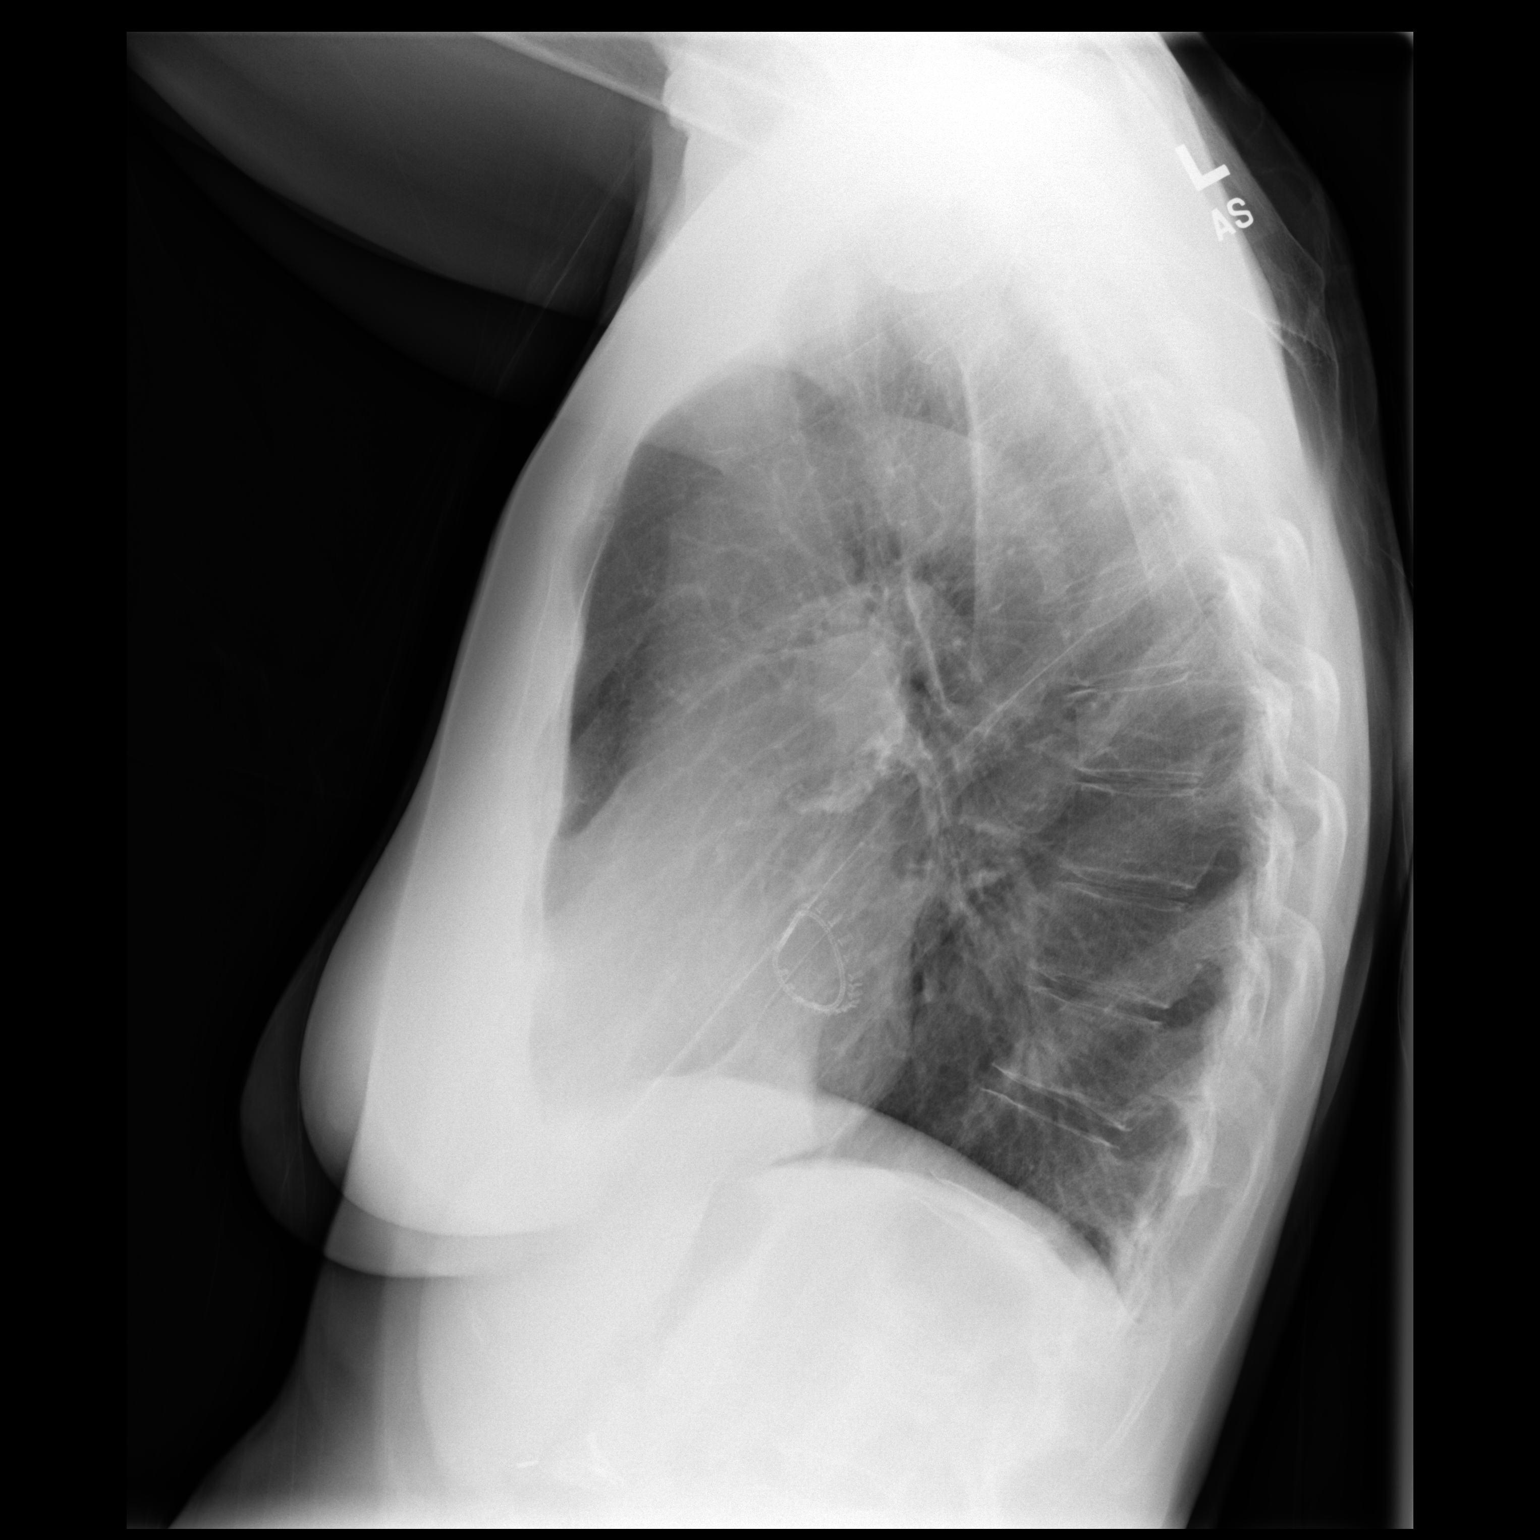

[2 of 2 positions shown; findings below may reference images not displayed]

FINDINGS: The lungs are well-expanded. There is no focal infiltrate. There is
a small amount of pleural thickening noted overlying the lateral
aspect of the right eighth rib. There is no pneumothorax or pleural
effusion. The heart and pulmonary vascularity are normal. The
prosthetic mitral valve ring is in stable position. The bony thorax
exhibits no acute abnormality.
IMPRESSION: There is no active cardiopulmonary disease.

## 2019-02-26 ENCOUNTER — Telehealth: Payer: Medicare Other | Admitting: Thoracic Surgery (Cardiothoracic Vascular Surgery)

## 2019-03-19 ENCOUNTER — Encounter: Payer: Medicare Other | Admitting: Thoracic Surgery (Cardiothoracic Vascular Surgery)

## 2019-05-07 ENCOUNTER — Ambulatory Visit: Payer: Medicare Other | Admitting: Thoracic Surgery (Cardiothoracic Vascular Surgery)

## 2019-05-07 ENCOUNTER — Other Ambulatory Visit: Payer: Self-pay

## 2019-05-07 ENCOUNTER — Encounter: Payer: Self-pay | Admitting: Thoracic Surgery (Cardiothoracic Vascular Surgery)

## 2019-05-07 VITALS — BP 117/73 | HR 62 | Temp 97.9°F | Resp 16 | Ht 69.0 in | Wt 154.0 lb

## 2019-05-07 DIAGNOSIS — Z9889 Other specified postprocedural states: Secondary | ICD-10-CM | POA: Diagnosis not present

## 2019-05-07 DIAGNOSIS — I712 Thoracic aortic aneurysm, without rupture, unspecified: Secondary | ICD-10-CM

## 2019-05-07 DIAGNOSIS — I340 Nonrheumatic mitral (valve) insufficiency: Secondary | ICD-10-CM | POA: Diagnosis not present

## 2019-05-07 NOTE — Patient Instructions (Signed)

## 2019-05-07 NOTE — Progress Notes (Signed)
[address]       [place] [address]             [phone]     CARDIOTHORACIC SURGERY OFFICE NOTE  Referring Provider is Stanford Breed Denice Bors, MD Primary Cardiologist is No primary care provider on file. PCP is Belva Bertin, [place], [place]   HPI:  Patient is a 68 year old female with history of mitral valve prolapse, rheumatic fever during childhood, frequent PVCs and palpitations, obstructive sleep apnea,and hypothyroidism whoreturns to the office today for routine follow-up status post minimally invasive mitral valve repair on Mar 01, 2018 for mitral valve prolapse with severe symptomatic primary mitral regurgitation.  Patient is postoperative recovery was uneventful and routine postoperative transthoracic echocardiogram performed May 11, 2018 revealed normal left ventricular systolic function with ejection fraction estimated 55 to 60%.  Mitral valve repair was intact with no residual mitral regurgitation.  Mean transvalvular gradient across mitral valve was estimated 3 mmHg.  Patient was last seen here in our office for routine follow-up on August 21, 2018.  She was seen in follow-up by Dr. Stanford Breed last January and she returns to our office for routine follow-up today.  She reports that she is doing "terrific".  She states that she feels "much improved" in comparison with how she felt prior to surgery.  She is making an effort to exercise daily, walking 11,000 and 12,000 steps per day.  She states that her exercise tolerance is much better than it was prior to surgery and she feels much better.  She states with exertion she gets short of breath only with strenuous exertion and this does not limit her activities to any significant degree.  She denies any history of exertional chest pain or chest tightness.  Overall she is quite pleased with her progress.   Current Outpatient Medications  Medication Sig Dispense Refill  . aspirin EC 81 MG EC tablet Take 1 tablet (81  mg total) by mouth daily.    Marland Kitchen azelastine (ASTELIN) 0.1 % nasal spray Place 1 spray into both nostrils daily as needed for allergies.     . cetirizine (ZYRTEC) 10 MG tablet Take 10 mg by mouth daily.    . Cholecalciferol (VITAMIN D) 2000 UNITS CAPS Take 2,000 Units by mouth daily.     . cyanocobalamin (,VITAMIN B-12,) 1000 MCG/ML injection Inject 1,000 mcg into the muscle 3 (three) times a week.    . estazolam (PROSOM) 2 MG tablet Take 1-2 mg by mouth at bedtime as needed (for sleep).     Marland Kitchen estradiol (CLIMARA - DOSED IN MG/24 HR) 0.05 mg/24hr patch Place 1 patch (0.05 mg total) onto the skin once a week. (Patient taking differently: Place 0.05 mg onto the skin every Sunday. ) 4 patch 1  . ketotifen (ZADITOR) 0.025 % ophthalmic solution Place 1 drop into both eyes 2 (two) times daily as needed (for dry eyes).    Marland Kitchen levothyroxine (SYNTHROID, LEVOTHROID) 88 MCG tablet Take 100 mcg by mouth daily before breakfast.     . Multiple Vitamins-Minerals (MULTIVITAL PO) Take 1 tablet by mouth daily.     . progesterone (PROMETRIUM) 200 MG capsule Take 1 capsule (200 mg total) by mouth daily. 90 capsule 0  . sodium chloride (OCEAN) 0.65 % SOLN nasal spray Place 1-2 sprays into both nostrils as needed for congestion.    . traMADol (ULTRAM) 50 MG tablet Take 1-2 tablets (50-100 mg total) by mouth every 6 (six) hours as needed for  moderate pain. 30 tablet 0   No current facility-administered medications for this visit.       Physical Exam:   BP 117/73 (BP Location: Right Arm, Patient Position: Sitting, Cuff Size: Normal)   Pulse 62   Temp 97.9 F (36.6 C) Comment: thermal  Resp 16   Ht 5\' 9"  (1.753 m)   Wt 154 lb (69.9 kg)   LMP 09/17/2012   SpO2 96% Comment: RA  BMI 22.74 kg/m   General:  Well-appearing  Chest:   Clear to auscultation  CV:   Regular rate and rhythm without murmur  Incisions:  Completely healed  Abdomen:  Soft nontender  Extremities:  Warm and well-perfused  Diagnostic Tests:   Transthoracic Echocardiography  Patient:    Tammy, Montoya MR #:       [id number] Study Date: 05/11/2018 Gender:     F Age:        68 Height:     175.3 cm Weight:     71.2 kg BSA:        1.87 m^2 Pt. Status: Room:   [place]  [place], [place]  SONOGRAPHER  Cardell Peach, RDCS  cc:  ------------------------------------------------------------------- LV EF: 55% -   60%  ------------------------------------------------------------------- History:   PMH:  MVP (mitral valve prolapse)  S/P mitral valve repair.  Mitral valve disease.  Mitral valve prolapse.  ------------------------------------------------------------------- Study Conclusions  - Left ventricle: The cavity size was normal. Systolic function was   normal. The estimated ejection fraction was in the range of 55%   to 60%. Left ventricular diastolic function parameters were   normal. - Mitral valve: Mildly to moderately thickened annulus. Valve area   by pressure half-time: 2.47 cm^2. Valve area by continuity   equation (using LVOT flow): 1.65 cm^2. - Left atrium: The atrium was mildly dilated. - Right atrium: The atrium was mildly dilated.  Impressions:  - Normal LVEF.   S/P MV repair - no significant stenosis ( Peak / mean gradient   6/3 mmHg, MVA 2.89 cm2). NO MR.   Mild biatrial enlargement.   Normal pulmonary pressure.  ------------------------------------------------------------------- Study data:  Comparison was made to the study of 03/01/2018.  Study status:  Routine.  Procedure:  Transthoracic echocardiography. Image quality was adequate.          Transthoracic echocardiography.  M-mode, complete 2D, spectral Doppler, and color Doppler.  Birthdate:  Patient birthdate: 1951-09-30.  Age:  Patient is 68 yr old.  Sex:  Gender: female.    BMI: 23.2 kg/m^2.  Blood pressure:     125/78  Patient  status:  Outpatient.  Study date: Study date: 05/11/2018. Study time: 03:20 PM.  Location:  [facility] laboratory.  -------------------------------------------------------------------  ------------------------------------------------------------------- Left ventricle:  The cavity size was normal. Systolic function was normal. The estimated ejection fraction was in the range of 55% to 60%. The transmitral flow pattern was normal. The deceleration time of the early transmitral flow velocity was normal. The pulmonary vein flow pattern was normal. The tissue Doppler parameters were normal. Left ventricular diastolic function parameters were normal.   ------------------------------------------------------------------- Aortic valve:   Structurally normal valve.   Cusp separation was normal.  Doppler:  Transvalvular velocity was within the normal range. There was no stenosis. There was no regurgitation.  ------------------------------------------------------------------- Mitral valve:   Mildly to moderately thickened annulus.  Doppler: There was  no evidence for stenosis.   There was no regurgitation.  Valve area by pressure half-time: 2.47 cm^2. Indexed valve area by pressure half-time: 1.32 cm^2/m^2. Valve area by continuity equation (using LVOT flow): 1.65 cm^2. Indexed valve area by continuity equation (using LVOT flow): 0.88 cm^2/m^2.    Mean gradient (D): 4 mm Hg. Peak gradient (D): 8 mm Hg.  ------------------------------------------------------------------- Left atrium:  The atrium was mildly dilated.  ------------------------------------------------------------------- Right ventricle:  The cavity size was normal. Wall thickness was normal. Systolic function was normal.  ------------------------------------------------------------------- Pulmonic valve:    Structurally normal valve.   Cusp separation was normal.  Doppler:  Transvalvular velocity was within the normal range.  There was no regurgitation.  ------------------------------------------------------------------- Tricuspid valve:   Structurally normal valve.   Leaflet separation was normal.  Doppler:  Transvalvular velocity was within the normal range. There was trivial regurgitation.  ------------------------------------------------------------------- Right atrium:  The atrium was mildly dilated.  ------------------------------------------------------------------- Pericardium:  The pericardium was normal in appearance.  ------------------------------------------------------------------- Measurements   Left ventricle                           Value          Reference  LV ID, ED, PLAX chordal                  46    mm       43 - 52  LV ID, ES, PLAX chordal                  26.2  mm       23 - 38  LV fx shortening, PLAX chordal           43    %        >=29  LV PW thickness, ED                      10.5  mm       ----------  IVS/LV PW ratio, ED                      0.82           <=1.3  Stroke volume, 2D                        68    ml       ----------  Stroke volume/bsa, 2D                    36    ml/m^2   ----------  LV e&', lateral                           11.6  cm/s     ----------  LV E/e&', lateral                         11.9           ----------  LV e&', medial                            7.62  cm/s     ----------  LV E/e&', medial  18.11          ----------  LV e&', average                           9.61  cm/s     ----------  LV E/e&', average                         14.36          ----------    Ventricular septum                       Value          Reference  IVS thickness, ED                        8.63  mm       ----------    LVOT                                     Value          Reference  LVOT ID, S                               20    mm       ----------  LVOT area                                3.14  cm^2     ----------  LVOT peak velocity, S                     115   cm/s     ----------  LVOT mean velocity, S                    75.4  cm/s     ----------  LVOT VTI, S                              21.7  cm       ----------  LVOT peak gradient, S                    5     mm Hg    ----------    Aorta                                    Value          Reference  Aortic root ID, ED                       28    mm       ----------    Left atrium                              Value          Reference  LA ID, A-P, ES  33    mm       ----------  LA ID/bsa, A-P                           1.77  cm/m^2   <=2.2  LA volume, S                             53.8  ml       ----------  LA volume/bsa, S                         28.8  ml/m^2   ----------  LA volume, ES, 1-p A4C                   50.9  ml       ----------  LA volume/bsa, ES, 1-p A4C               27.3  ml/m^2   ----------  LA volume, ES, 1-p A2C                   55.1  ml       ----------  LA volume/bsa, ES, 1-p A2C               29.5  ml/m^2   ----------    Mitral valve                             Value          Reference  Mitral E-wave peak velocity              138   cm/s     ----------  Mitral A-wave peak velocity              115   cm/s     ----------  Mitral mean velocity, D                  83.1  cm/s     ----------  Mitral deceleration time         (H)     310   ms       150 - 230  Mitral pressure half-time                102   ms       ----------  Mitral mean gradient, D                  4     mm Hg    ----------  Mitral peak gradient, D                  8     mm Hg    ----------  Mitral E/A ratio, peak                   1.2            ----------  Mitral valve area, PHT, DP               2.47  cm^2     ----------  Mitral valve area/bsa, PHT, DP           1.32  cm^2/m^2 ----------  Mitral valve area, LVOT  1.65  cm^2     ----------  continuity  Mitral valve area/bsa, LVOT              0.88  cm^2/m^2 ----------  continuity  Mitral annulus VTI, D                     41.4  cm       ----------    Pulmonary arteries                       Value          Reference  PA pressure, S, DP                       30    mm Hg    <=30    Tricuspid valve                          Value          Reference  Tricuspid regurg peak velocity           262   cm/s     ----------  Tricuspid peak RV-RA gradient            27    mm Hg    ----------    Right atrium                             Value          Reference  RA ID, S-I, ES, A4C              (H)     50.3  mm       34 - 49  RA area, ES, A4C                         18.2  cm^2     8.3 - 19.5  RA volume, ES, A/L                       54.3  ml       ----------  RA volume/bsa, ES, A/L                   29.1  ml/m^2   ----------    Systemic veins                           Value          Reference  Estimated CVP                            3     mm Hg    ----------    Right ventricle                          Value          Reference  TAPSE                                    15.7  mm       ----------  RV pressure, S, DP  30    mm Hg    <=30  RV s&', lateral, S                        10.3  cm/s     ----------  Legend: (L)  and  (H)  mark values outside specified reference range.  ------------------------------------------------------------------- Prepared and Electronically Authenticated by  Jenne Campus, MD 2019-07-26T11:38:00   Impression:  Patient is doing very well more than 1 year status post minimally invasive mitral valve repair  Plan:  We have not recommended any change the patient's current medications.  I have encouraged the patient to continue to increase her activity and exercise on a regular basis.  In the future she will call and return to see [place] as needed.  She will continue to follow-up intermittently with Dr. Stanford Breed.  I spent in excess of 15 minutes during the conduct of this office consultation and >50% of this time involved direct face-to-face  encounter with the patient for counseling and/or coordination of their care.    [name], MD 05/07/2019 12:06 PM

## 2019-12-11 NOTE — Progress Notes (Signed)
HPI: FUPVCs, MVP/MV repair.Holter monitor January 2019 showed sinus rhythm with PACs, brief PAT, PVCs, rare couplet and nonsustained ventricular tachycardia.TEE 2/19 showed normal LV function, severe prolapse of P2 with severe MR, moderate to severe LAE.Cardiac catheterization May 2019 showed normal coronary arteries and findings consistent with severe mitral regurgitation. Carotid Dopplers May 2019 showed no significant stenosis. Patient underwent mitral valve repair on Mar 01, 2018. She did develop postoperative atrial fibrillation treated with amiodarone. Follow-up echocardiogram July 2019 showed normal LV function, prior mitral valve repair with mean gradient 3 mmHg, no mitral regurgitation and mild biatrial enlargement.Since last seenthe patient has dyspnea with more extreme activities but not with routine activities. It is relieved with rest. It is not associated with chest pain. There is no orthopnea, PND or pedal edema. There is no syncope or palpitations. There is no exertional chest pain.   Current Outpatient Medications  Medication Sig Dispense Refill  . aspirin EC 81 MG EC tablet Take 1 tablet (81 mg total) by mouth daily.    Marland Kitchen azelastine (ASTELIN) 0.1 % nasal spray Place 1 spray into both nostrils daily as needed for allergies.     . cetirizine (ZYRTEC) 10 MG tablet Take 10 mg by mouth daily.    . Cholecalciferol (VITAMIN D) 2000 UNITS CAPS Take 2,000 Units by mouth daily.     . cyanocobalamin (,VITAMIN B-12,) 1000 MCG/ML injection Inject 1,000 mcg into the muscle 3 (three) times a week.    . estazolam (PROSOM) 2 MG tablet Take 1-2 mg by mouth at bedtime as needed (for sleep).     Marland Kitchen estradiol (CLIMARA - DOSED IN MG/24 HR) 0.05 mg/24hr patch Place 1 patch (0.05 mg total) onto the skin once a week. (Patient taking differently: Place 0.05 mg onto the skin every Sunday. ) 4 patch 1  . hydrOXYzine (ATARAX/VISTARIL) 25 MG tablet Take 1 tablet by mouth three times daily as  needed    . ketotifen (ZADITOR) 0.025 % ophthalmic solution Place 1 drop into both eyes 2 (two) times daily as needed (for dry eyes).    Marland Kitchen levothyroxine (SYNTHROID, LEVOTHROID) 88 MCG tablet Take 100 mcg by mouth daily before breakfast.     . lidocaine (LIDODERM) 5 % Apply patch to painful area. Patch may remain in place for up to 12 hours in a 24 hour period.    . meloxicam (MOBIC) 15 MG tablet Take 1 tablet by mouth once daily    . Multiple Vitamins-Minerals (MULTIVITAL PO) Take 1 tablet by mouth daily.     . progesterone (PROMETRIUM) 200 MG capsule Take 1 capsule (200 mg total) by mouth daily. 90 capsule 0  . sodium chloride (OCEAN) 0.65 % SOLN nasal spray Place 1-2 sprays into both nostrils as needed for congestion.    . traMADol (ULTRAM) 50 MG tablet Take 1-2 tablets (50-100 mg total) by mouth every 6 (six) hours as needed for moderate pain. 30 tablet 0   No current facility-administered medications for this visit.     Past Medical History:  Diagnosis Date  . Basal cell carcinoma of right lower leg   . Dyspnea    all the time  . Fibrocystic disease of breast   . Hypothyroid   . IBS (irritable bowel syndrome)   . Mitral regurgitation   . MVP (mitral valve prolapse)    Murmur  . Pernicious anemia   . Rheumatic fever    as child  . S/P minimally invasive mitral valve repair 03/01/2018  Complex valvuloplasty including artificial Gore-tex neochord placement x12 and Sorin Memo Ring Annuloplasty 4DM-32, size 32 via right mini thoracotomy approach  . Simple endometrial hyperplasia without atypia   . Sleep apnea     Past Surgical History:  Procedure Laterality Date  . APPENDECTOMY    . LAPAROSCOPIC CHOLECYSTECTOMY  03/2002  . MITRAL VALVE REPAIR Right 03/01/2018   Procedure: MINIMALLY INVASIVE MITRAL VALVE REPAIR (MVR);  Surgeon: [name], MD;  Location: [facility];  Service: Open Heart Surgery;  Laterality: Right;  Using LivaNova Memo 4D Size 32 Annuloplasty Ring  . RIGHT/LEFT  HEART CATH AND CORONARY ANGIOGRAPHY N/A 02/20/2018   Procedure: RIGHT/LEFT HEART CATH AND CORONARY ANGIOGRAPHY;  Surgeon: Sherren Mocha, MD;  Location: [facility];  Service: Cardiovascular;  Laterality: N/A;  . TEE WITHOUT CARDIOVERSION N/A 12/09/2017   Procedure: TRANSESOPHAGEAL ECHOCARDIOGRAM (TEE);  Surgeon: Lelon Perla, MD;  Location: [facility] ENDOS[facility];  Service: Cardiovascular;  Laterality: N/A;  . TEE WITHOUT CARDIOVERSION N/A 03/01/2018   Procedure: TRANSESOPHAGEAL ECHOCARDIOGRAM (TEE);  Surgeon: [name], MD;  Location: [facility];  Service: Open Heart Surgery;  Laterality: N/A;  . TONSILLECTOMY AND ADENOIDECTOMY      Social History   Socioeconomic History  . Marital status: Married    Spouse name: Not on file  . Number of children: 2  . Years of education: Not on file  . Highest education level: Not on file  Occupational History  . Not on file  Tobacco Use  . Smoking status: Never Smoker  . Smokeless tobacco: Never Used  Substance and Sexual Activity  . Alcohol use: No  . Drug use: No  . Sexual activity: Not on file  Other Topics Concern  . Not on file  Social History Narrative  . Not on file   Social Determinants of Health   Financial Resource Strain:   . Difficulty of Paying Living Expenses: Not on file  Food Insecurity:   . Worried About Charity fundraiser in the Last Year: Not on file  . Ran Out of Food in the Last Year: Not on file  Transportation Needs:   . Lack of Transportation (Medical): Not on file  . Lack of Transportation (Non-Medical): Not on file  Physical Activity:   . Days of Exercise per Week: Not on file  . Minutes of Exercise per Session: Not on file  Stress:   . Feeling of Stress : Not on file  Social Connections:   . Frequency of Communication with Friends and Family: Not on file  . Frequency of Social Gatherings with Friends and Family: Not on file  . Attends Religious Services: Not on file  . Active Member of Clubs or  Organizations: Not on file  . Attends Archivist Meetings: Not on file  . Marital Status: Not on file  Intimate Partner Violence:   . Fear of Current or Ex-Partner: Not on file  . Emotionally Abused: Not on file  . Physically Abused: Not on file  . Sexually Abused: Not on file    Family History  Problem Relation Age of Onset  . Diabetes Father   . Heart attack Father   . Osteoporosis Mother   . Diabetes Brother   . Heart failure Brother   . Diabetes Paternal Grandmother     ROS: no fevers or chills, productive cough, hemoptysis, dysphasia, odynophagia, melena, hematochezia, dysuria, hematuria, rash, seizure activity, orthopnea, PND, pedal edema, claudication. Remaining systems are negative.  Physical Exam: Well-developed  well-nourished in no acute distress.  Skin is warm and dry.  HEENT is normal.  Neck is supple.  Chest is clear to auscultation with normal expansion.  Cardiovascular exam is regular rate and rhythm.  Abdominal exam nontender or distended. No masses palpated. Extremities show no edema. neuro grossly intact  ECG-sinus rhythm at a rate of 67, no ST changes.  Personally reviewed  A/P  1 prior mitral valve repair-patient denies dyspnea or chest pain.  Continue SBE prophylaxis.  2 history of PVCs/nonsustained ventricular tachycardia-question related to previous mitral valve prolapse/scarring of papillary muscles.  Patient without symptoms.  LV function normal.  Can add beta-blockade later if needed.  Kirk Ruths, MD

## 2019-12-12 ENCOUNTER — Ambulatory Visit: Payer: Medicare Other | Admitting: Cardiology

## 2019-12-12 ENCOUNTER — Encounter: Payer: Self-pay | Admitting: *Deleted

## 2019-12-12 ENCOUNTER — Encounter: Payer: Self-pay | Admitting: Cardiology

## 2019-12-12 ENCOUNTER — Other Ambulatory Visit: Payer: Self-pay

## 2019-12-12 VITALS — BP 110/70 | HR 67 | Ht 69.0 in | Wt 150.0 lb

## 2019-12-12 DIAGNOSIS — I493 Ventricular premature depolarization: Secondary | ICD-10-CM | POA: Diagnosis not present

## 2019-12-12 DIAGNOSIS — Z9889 Other specified postprocedural states: Secondary | ICD-10-CM

## 2019-12-12 NOTE — Telephone Encounter (Signed)
This encounter was created in error - please disregard.

## 2019-12-12 NOTE — Patient Instructions (Signed)

## 2021-04-23 ENCOUNTER — Telehealth: Payer: Self-pay | Admitting: Cardiology

## 2021-04-23 NOTE — Telephone Encounter (Signed)
Per Dr. Jacalyn Lefevre last office note on 12/12/2019 he stated "prior mitral valve repair-patient denies dyspnea or chest pain.  Continue SBE prophylaxis." Informed patient of this and patient verbalized understanding.

## 2021-04-23 NOTE — Telephone Encounter (Signed)
Patient called to say that she has a dentist appt today and she wanted to know if she still should take her anabiotics. She is getting her teeth clean. Please advise

## 2022-04-20 ENCOUNTER — Emergency Department (HOSPITAL_BASED_OUTPATIENT_CLINIC_OR_DEPARTMENT_OTHER)
Admission: EM | Admit: 2022-04-20 | Discharge: 2022-04-21 | Disposition: A | Discharge status: Home / Self Care | Payer: Medicare Other | Attending: Emergency Medicine | Admitting: Emergency Medicine

## 2022-04-20 ENCOUNTER — Emergency Department (HOSPITAL_BASED_OUTPATIENT_CLINIC_OR_DEPARTMENT_OTHER): Payer: Medicare Other

## 2022-04-20 ENCOUNTER — Other Ambulatory Visit: Payer: Self-pay

## 2022-04-20 ENCOUNTER — Encounter (HOSPITAL_BASED_OUTPATIENT_CLINIC_OR_DEPARTMENT_OTHER): Payer: Self-pay | Admitting: Urology

## 2022-04-20 DIAGNOSIS — S0181XA Laceration without foreign body of other part of head, initial encounter: Secondary | ICD-10-CM | POA: Diagnosis not present

## 2022-04-20 DIAGNOSIS — S40212A Abrasion of left shoulder, initial encounter: Secondary | ICD-10-CM | POA: Diagnosis not present

## 2022-04-20 DIAGNOSIS — S01112A Laceration without foreign body of left eyelid and periocular area, initial encounter: Secondary | ICD-10-CM | POA: Insufficient documentation

## 2022-04-20 DIAGNOSIS — W19XXXA Unspecified fall, initial encounter: Secondary | ICD-10-CM

## 2022-04-20 DIAGNOSIS — W010XXA Fall on same level from slipping, tripping and stumbling without subsequent striking against object, initial encounter: Secondary | ICD-10-CM | POA: Diagnosis not present

## 2022-04-20 DIAGNOSIS — S80212A Abrasion, left knee, initial encounter: Secondary | ICD-10-CM | POA: Insufficient documentation

## 2022-04-20 DIAGNOSIS — Z23 Encounter for immunization: Secondary | ICD-10-CM | POA: Diagnosis not present

## 2022-04-20 DIAGNOSIS — Z7982 Long term (current) use of aspirin: Secondary | ICD-10-CM | POA: Insufficient documentation

## 2022-04-20 DIAGNOSIS — S0993XA Unspecified injury of face, initial encounter: Secondary | ICD-10-CM | POA: Diagnosis present

## 2022-04-20 MED ORDER — LIDOCAINE-EPINEPHRINE (PF) 2 %-1:200000 IJ SOLN
10.0000 mL | Freq: Once | INTRAMUSCULAR | Status: AC
  Administered 2022-04-20: 10 mL
  Filled 2022-04-20: qty 20

## 2022-04-20 MED ORDER — TETANUS-DIPHTH-ACELL PERTUSSIS 5-2.5-18.5 LF-MCG/0.5 IM SUSY
0.5000 mL | PREFILLED_SYRINGE | Freq: Once | INTRAMUSCULAR | Status: AC
  Administered 2022-04-21: 0.5 mL via INTRAMUSCULAR
  Filled 2022-04-20: qty 0.5

## 2022-04-20 MED ORDER — ACETAMINOPHEN 500 MG PO TABS
1000.0000 mg | ORAL_TABLET | Freq: Four times a day (QID) | ORAL | Status: DC | PRN
  Administered 2022-04-21: 1000 mg via ORAL
  Filled 2022-04-20: qty 2

## 2022-04-20 NOTE — ED Triage Notes (Signed)
Pt had mechanical fall walking home from fireworks, tripped over curb  triangular laceration above left eyebrow, abrasion to chin and right hand, bleeding controlled Denies any LOC  Not on blood thinners, reports HA at site of laceration

## 2022-04-20 NOTE — ED Notes (Signed)
Patient c/o pain to L forehead/eyebrow s/p fall.  No reports of LOC.  C/o pain to L shoulder.  Takes ASA 81 mg PO daily.  No ETOH use tonight.

## 2022-04-20 NOTE — ED Notes (Signed)
Patient taken to CT at this time.

## 2022-04-20 NOTE — ED Provider Notes (Signed)
[facility] Provider Note   CSN: [id number] Arrival date & time: 04/20/22  2218     History  Chief Complaint  Patient presents with   Laceration    Tammy Montoya is a 71 y.o. female who presents to the ED for evaluation of fall.  Patient reports prior to arrival she was walking back with her family from fireworks festival when she tripped over the curve landing on the left side of her body including her left forehead.  Patient has significant laceration to left forehead above eyebrow.  Patient also has a superficial abrasions to left knee and left shoulder.  Patient denies loss of consciousness, she states that she was "dazed" after fall.  Patient denies being on blood thinners.  Patient denies lightheadedness, dizziness, weakness, chest pain, shortness of breath, nausea or vomiting.  Patient does endorse headache.   Laceration      Home Medications Prior to Admission medications   Medication Sig Start Date End Date Taking? Authorizing Provider  aspirin EC 81 MG EC tablet Take 1 tablet (81 mg total) by mouth daily. 03/07/18   Gold, Wayne E, PA-C  azelastine (ASTELIN) 0.1 % nasal spray Place 1 spray into both nostrils daily as needed for allergies.  10/24/17   [provider]  cetirizine (ZYRTEC) 10 MG tablet Take 10 mg by mouth daily.    [provider]  Cholecalciferol (VITAMIN D) 2000 UNITS CAPS Take 2,000 Units by mouth daily.     [provider]  cyanocobalamin (,VITAMIN B-12,) 1000 MCG/ML injection Inject 1,000 mcg into the muscle 3 (three) times a week.    [provider]  estazolam (PROSOM) 2 MG tablet Take 1-2 mg by mouth at bedtime as needed (for sleep).     [provider]  estradiol (CLIMARA - DOSED IN MG/24 HR) 0.05 mg/24hr patch Place 1 patch (0.05 mg total) onto the skin once a week. Patient taking differently: Place 0.05 mg onto the skin every Sunday.  10/21/14   Nunzio Cobbs, MD   hydrOXYzine (ATARAX/VISTARIL) 25 MG tablet Take 1 tablet by mouth three times daily as needed 11/30/19   [provider]  ketotifen (ZADITOR) 0.025 % ophthalmic solution Place 1 drop into both eyes 2 (two) times daily as needed (for dry eyes).    [provider]  levothyroxine (SYNTHROID, LEVOTHROID) 88 MCG tablet Take 100 mcg by mouth daily before breakfast.  01/09/18   [provider]  lidocaine (LIDODERM) 5 % Apply patch to painful area. Patch may remain in place for up to 12 hours in a 24 hour period. 08/09/19   [provider]  meloxicam (MOBIC) 15 MG tablet Take 1 tablet by mouth once daily 11/30/19   [provider]  Multiple Vitamins-Minerals (MULTIVITAL PO) Take 1 tablet by mouth daily.     [provider]  progesterone (PROMETRIUM) 200 MG capsule Take 1 capsule (200 mg total) by mouth daily. 07/28/14   Elveria Rising, MD  sodium chloride (OCEAN) 0.65 % SOLN nasal spray Place 1-2 sprays into both nostrils as needed for congestion.    [provider]  traMADol (ULTRAM) 50 MG tablet Take 1-2 tablets (50-100 mg total) by mouth every 6 (six) hours as needed for moderate pain. 03/07/18   John Giovanni, PA-C      Allergies    Gluten meal and Protonix [pantoprazole]    Review of Systems   Review of Systems  Respiratory:  Negative for  shortness of breath.   Cardiovascular:  Negative for chest pain.  Gastrointestinal:  Negative for nausea and vomiting.  Musculoskeletal:  Positive for arthralgias and myalgias.  Neurological:  Positive for headaches. Negative for dizziness, syncope and weakness.  All other systems reviewed and are negative.   Physical Exam Updated Vital Signs BP 135/86 (BP Location: Right Arm)   Pulse 73   Temp 98.7 F (37.1 C) (Oral)   Resp 18   Ht '5\' 9"'$  (1.753 m)   Wt 68 kg   LMP 09/17/2012   SpO2 96%   BMI 22.14 kg/m  Physical Exam Vitals and nursing note reviewed.  Constitutional:      General: She  is not in acute distress.    Appearance: Normal appearance. She is not ill-appearing, toxic-appearing or diaphoretic.  HENT:     Head: Normocephalic and atraumatic.     Nose: Nose normal. No congestion.     Mouth/Throat:     Mouth: Mucous membranes are moist.     Pharynx: Oropharynx is clear.  Eyes:     Extraocular Movements: Extraocular movements intact.     Conjunctiva/sclera: Conjunctivae normal.     Pupils: Pupils are equal, round, and reactive to light.  Cardiovascular:     Rate and Rhythm: Normal rate and regular rhythm.  Pulmonary:     Effort: Pulmonary effort is normal.     Breath sounds: Normal breath sounds. No wheezing.  Abdominal:     General: Abdomen is flat. Bowel sounds are normal.     Tenderness: There is no abdominal tenderness.  Musculoskeletal:     Cervical back: Normal range of motion and neck supple. No rigidity or tenderness.     Comments: Full range of motion noted to patient left knee, she has the ability to flex and extend without difficulty.  She ambulates without difficulty.  Patient has full range of motion to left shoulder without any focal tenderness.  Skin:    General: Skin is warm and dry.     Capillary Refill: Capillary refill takes less than 2 seconds.     Comments: 4 cm triangular laceration above patient left eyebrow.  Patient has superficial abrasion to left knee, left shoulder.  Neurological:     Mental Status: She is alert and oriented to person, place, and time.     GCS: GCS eye subscore is 4. GCS verbal subscore is 5. GCS motor subscore is 6.     Cranial Nerves: No cranial nerve deficit or dysarthria.     Sensory: Sensation is intact. No sensory deficit.     Motor: Motor function is intact. No weakness.     Coordination: Coordination is intact. Heel to [facility] Test normal.     ED Results / Procedures / Treatments   Labs (all labs ordered are listed, but only abnormal results are displayed) Labs Reviewed - No data to  display  EKG None  Radiology No results found.  Procedures .Marland KitchenLaceration Repair  Date/Time: 04/20/2022 11:58 PM  Performed by: Azucena Cecil, PA-C Authorized by: Azucena Cecil, PA-C   Consent:    Consent obtained:  Verbal   Consent given by:  Patient   Risks discussed:  Infection, need for additional repair, nerve damage, poor wound healing, tendon damage, vascular damage, poor cosmetic result and pain Universal protocol:    Patient identity confirmed:  Arm band and verbally with patient Anesthesia:    Anesthesia method:  Local infiltration   Local anesthetic:  Lidocaine 2% WITH epi Laceration  details:    Location:  Face   Face location:  L eyebrow   Length (cm):  4 Pre-procedure details:    Preparation:  Patient was prepped and draped in usual sterile fashion Exploration:    Hemostasis achieved with:  Epinephrine and direct pressure   Imaging outcome: foreign body not noted     Contaminated: no   Treatment:    Amount of cleaning:  Standard   Irrigation solution:  Sterile saline   Irrigation method:  Pressure wash   Visualized foreign bodies/material removed: no     Debridement:  None   Undermining:  None Skin repair:    Repair method:  Sutures   Suture size:  5-0   Suture material:  Prolene   Number of sutures:  5 Approximation:    Approximation:  Close Repair type:    Repair type:  Simple Post-procedure details:    Dressing:  Open (no dressing)   Procedure completion:  Tolerated well, no immediate complications    Medications Ordered in ED Medications  acetaminophen (TYLENOL) tablet 1,000 mg (has no administration in time range)  Tdap (BOOSTRIX) injection 0.5 mL (has no administration in time range)  lidocaine-EPINEPHrine (XYLOCAINE W/EPI) 2 %-1:200000 (PF) injection 10 mL (10 mLs Other Given 04/20/22 2321)    ED Course/ Medical Decision Making/ A&P                           Medical Decision Making Amount and/or Complexity of Data  Reviewed Radiology: ordered.  Risk OTC drugs. Prescription drug management.   71 year old female presents to ED for evaluation.  Please see HPI for further details  On examination, the patient is neurologically intact without any focal neurodeficits on examination.  Patient nontachycardic, afebrile.  Patient lung sounds clear bilaterally.  Patient abdomen soft and compressible.  Patient neurological examination shows no focal neurodeficits.  The patient has a 4 cm laceration with triangular shape over the left eyebrow.  Patient tetanus updated.  Please see procedure note for further details.  Patient given 1 g of Tylenol for headache.  CT head pending.  CT cervical spine pending.  At end of shift, patient's imaging studies had not yet resulted.  The patient will be signed out to oncoming provider Dr. Nicholes Stairs for disposition.   Final Clinical Impression(s) / ED Diagnoses Final diagnoses:  Facial laceration, initial encounter  Fall, initial encounter    Rx / DC Orders ED Discharge Orders     None         Lawana Chambers 04/21/22 0002    Drenda Freeze, MD 04/21/22 [phone]

## 2022-04-21 NOTE — Discharge Instructions (Signed)
Please return to the ED with any new or worsening symptoms Please return to urgent care or your PCP in 5 days for suture removal I have attached the name of the concussion specialist that we discussed at this documentation.  If after 1 week you are continued to have symptoms please follow-up with a specialist.

## 2022-06-28 ENCOUNTER — Telehealth: Payer: Self-pay | Admitting: Cardiology

## 2022-06-28 NOTE — Telephone Encounter (Signed)
Spoke with pt, Aware of dr crenshaw's recommendations.  °

## 2022-06-28 NOTE — Telephone Encounter (Signed)
Patient stated her PCP placed her on a 5-day cortipak. She stated she is afraid to take it because the PCP stated he didn't know how it would affect her heart. She wants the advise of Dr. Stanford Breed of the safety of taking the med for her sinus infection.

## 2022-06-28 NOTE — Telephone Encounter (Signed)
   Pt c/o medication issue:  1. Name of Medication: 5 day pack steroids   2. How are you currently taking this medication (dosage and times per day)?   3. Are you having a reaction (difficulty breathing--STAT)? No   4. What is your medication issue? Pt said, she is having a bad sinus infection and her pcp prescribed a 5 day pack steroids. She would like to ask Dr. Stanford Breed if that's ok to take with her other heart meds and if doesn't like for her to take steroids if Dr. Stanford Breed can prescribed antibiotics instead

## 2024-04-05 ENCOUNTER — Telehealth: Payer: Self-pay | Admitting: Cardiology

## 2024-04-05 NOTE — Telephone Encounter (Signed)
.  SYNCOPECHMG   Pt c/o Syncope: STAT if syncope occurred within 24 hours and pt complains of lightheadedness.   High Priority if episode of passing out, completely, today or in last 24 hours   1. Did you pass out today? No , almost passed out  today at the store  2. When is the last time you passed out?  2 weeks ago   3. Has this occurred multiple times? yes  4. Did you have any symptoms prior to passing out? Very lightheaded, sweaty, shaky, and crying  5. Did you fall? If so, are you on a blood thinner? Yes, on a low dose of Aspirin

## 2024-04-05 NOTE — Telephone Encounter (Signed)
 Spoke with pt who complains of near syncope today.  She denies current CP, SOB or dizziness and states she has since recovered and feels much better.  Pt states she did actually pass out 2 weeks ago.  She does not have a current BP or HR.  Pt states she saw her PCP yesterday for further evaluation and encouraged to schedule an appointment with cardiology.   Reviewed ED precautions and appointment scheduled with Dr Audery Blazing on 04/17/2024.  Pt verbalizes understanding and agrees with current plan.

## 2024-04-10 NOTE — Progress Notes (Signed)
 Referring-[place]  Fulbright, PA-C. Reason for referral-near syncope and history of mitral valve repair  HPI: 73 year old female for evaluation of near syncope and history of mitral valve repair at request of [place]  Fulbright, PA-C.  Patient seen previously but not since February 2021.  She has a history of PVCs as well as mitral valve prolapse status post mitral valve repair.  Holter monitor January 2019 showed sinus rhythm with PACs, brief PAT, PVCs, rare couplet and nonsustained ventricular tachycardia. TEE 2/19 showed normal LV function, severe prolapse of P2 with severe MR, moderate to severe LAE.  Cardiac catheterization May 2019 showed normal coronary arteries and findings consistent with severe mitral regurgitation.  Carotid Dopplers May 2019 showed no significant stenosis.  Patient underwent mitral valve repair on Mar 01, 2018.  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 Follow-up echocardiogram July 2019 showed normal LV function, prior mitral valve repair with mean gradient 3 mmHg, no mitral regurgitation and mild biatrial enlargement. Since last seen patient states that recently she has had occasional palpitations described as her heart skipping and fluttering.  She also states that her blood pressure is running low with diastolic less than 60 she feels fatigued.  She denies chest pain.  She has had 2 episodes where she did not realize where she was for 2 hours.  She did not have frank syncope based on history.  Current Outpatient Medications  Medication Sig Dispense Refill   aspirin  EC 81 MG EC tablet Take 1 tablet (81 mg total) by mouth daily.     azelastine (ASTELIN) 0.1 % nasal spray Place 1 spray into both nostrils daily as needed for allergies.      cetirizine (ZYRTEC) 10 MG tablet Take 10 mg by mouth daily.     Cholecalciferol (VITAMIN D) 2000 UNITS CAPS Take 2,000 Units by mouth daily.      cyanocobalamin (VITAMIN B12) 1000 MCG tablet Take  1,000 mcg by mouth daily.     estazolam (PROSOM) 2 MG tablet Take 1-2 mg by mouth at bedtime as needed (for sleep).      estradiol  (CLIMARA  - DOSED IN MG/24 HR) 0.05 mg/24hr patch Place 1 patch (0.05 mg total) onto the skin once a week. 4 patch 1   ketotifen (ZADITOR) 0.025 % ophthalmic solution Place 1 drop into both eyes 2 (two) times daily as needed (for dry eyes).     levothyroxine  (SYNTHROID , LEVOTHROID) 88 MCG tablet Take 100 mcg by mouth daily before breakfast.      lidocaine  (LIDODERM ) 5 % Apply patch to painful area. Patch may remain in place for up to 12 hours in a 24 hour period.     meloxicam (MOBIC) 15 MG tablet Take 1 tablet by mouth once daily     Multiple Vitamins-Minerals (MULTIVITAL PO) Take 1 tablet by mouth daily.      progesterone  (PROMETRIUM ) 200 MG capsule Take 1 capsule (200 mg total) by mouth daily. 90 capsule 0   sodium chloride  (OCEAN) 0.65 % SOLN nasal spray Place 1-2 sprays into both nostrils as needed for congestion.     traMADol  (ULTRAM ) 50 MG tablet Take 1-2 tablets (50-100 mg total) by mouth every 6 (six) hours as needed for moderate pain. 30 tablet 0   hydrOXYzine (ATARAX/VISTARIL) 25 MG tablet Take 1 tablet by mouth three times daily as needed (Patient not taking: Reported on 04/17/2024)     No current facility-administered medications for this visit.    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/2018   Complex valvuloplasty including artificial Gore-tex neochord placement x12 and Sorin Memo Ring Annuloplasty 4DM-32, size  32 via right mini thoracotomy approach   Simple endometrial hyperplasia without atypia    Sleep apnea     Past Surgical History:  Procedure Laterality Date   APPENDECTOMY     LAPAROSCOPIC CHOLECYSTECTOMY  03/2002   MITRAL VALVE REPAIR Right 03/01/2018   Procedure: MINIMALLY INVASIVE MITRAL VALVE REPAIR (MVR);  Surgeon: Dusty Sudie DEL, MD;  Location: [facility] OR;  Service: Open Heart Surgery;  Laterality: Right;  Using LivaNova Memo 4D Size 32 Annuloplasty Ring   RIGHT/LEFT HEART CATH AND CORONARY ANGIOGRAPHY N/A 02/20/2018   Procedure: RIGHT/LEFT HEART CATH AND CORONARY ANGIOGRAPHY;  Surgeon: Wonda Sharper, MD;  Location: [facility] INVASIVE CV LAB;  Service: Cardiovascular;  Laterality: N/A;   TEE WITHOUT CARDIOVERSION N/A 12/09/2017   Procedure: TRANSESOPHAGEAL ECHOCARDIOGRAM (TEE);  Surgeon: Pietro Redell RAMAN, MD;  Location: [facility] ENDOSCOPY;  Service: Cardiovascular;  Laterality: N/A;   TEE WITHOUT CARDIOVERSION N/A 03/01/2018   Procedure: TRANSESOPHAGEAL ECHOCARDIOGRAM (TEE);  Surgeon: Dusty Sudie DEL, MD;  Location: [facility] OR;  Service: Open Heart Surgery;  Laterality: N/A;   TONSILLECTOMY AND ADENOIDECTOMY      Social History   Socioeconomic History   Marital status: Married    Spouse name: Not on file   Number of children: 2   Years of education: Not on file   Highest education level: Not on file  Occupational History   Not on file  Tobacco Use   Smoking status: Never   Smokeless tobacco: Never  Vaping Use   Vaping status: Never Used  Substance and Sexual Activity   Alcohol use: No   Drug use: No   Sexual activity: Not on file  Other Topics Concern   Not on file  Social History Narrative   Not on file   Social Drivers of Health   Financial Resource Strain: Not on file  Food Insecurity: Low Risk  (04/02/2024)   Received from [facility]   Hunger Vital Sign    Within the past 12 months, you worried that your food would run out before you got money to buy more: Never true    Within  the past 12 months, the food you bought just didn't last and you didn't have money to get more. : Never true  Transportation Needs: No Transportation Needs (04/02/2024)   Received from [employer]    In the past 12 months, has lack of reliable transportation kept you from medical appointments, meetings, work or from getting things needed for daily living? : No  Physical Activity: Not on file  Stress: Not on file  Social Connections: Not on file  Intimate Partner Violence: Not on file    Family History  Problem Relation Age of Onset   Diabetes Father    Heart attack Father    Osteoporosis Mother  Diabetes Brother    Heart failure Brother    Diabetes Paternal Grandmother     ROS: no fevers or chills, productive cough, hemoptysis, dysphasia, odynophagia, melena, hematochezia, dysuria, hematuria, rash, seizure activity, orthopnea, PND, pedal edema, claudication. Remaining systems are negative.  Physical Exam:   Blood pressure 136/76, pulse 64, height 5' 9 (1.753 m), weight 162 lb (73.5 kg), last menstrual period 09/17/2012.  General:  Well developed/well nourished in NAD Skin warm/dry Patient not depressed No peripheral clubbing Back-normal HEENT-normal/normal eyelids Neck supple/normal carotid upstroke bilaterally; no bruits; no JVD; no thyromegaly chest - CTA/ normal expansion CV - RRR/normal S1 and S2; no murmurs, rubs or gallops;  PMI nondisplaced Abdomen -NT/ND, no HSM, no mass, + bowel sounds, no bruit 2+ femoral pulses, no bruits Ext-no edema, chords, 2+ DP Neuro-grossly nonfocal  EKG Interpretation Date/Time:  Tuesday April 17 2024 14:26:59 EDT Ventricular Rate:  64 PR Interval:  178 QRS Duration:  80 QT Interval:  420 QTC Calculation: 433 R Axis:   63  Text Interpretation: Normal sinus rhythm Normal ECG Confirmed by Pietro Rogue ([id number]) on 04/17/2024 2:36:56 PM    A/P  1 near syncope-patient describes an episode where she did not  realize where she was for approximately 1 hour.  Not clear that she lost consciousness.  No evidence of arrhythmia at this point.  2 history of mitral valve repair-plan echocardiogram to reassess LV function as well as mitral valve repair.  Continue SBE prophylaxis.  3 history of PVCs/nonsustained ventricular tachycardia-this is felt possibly secondary to prior mitral valve prolapse/scarring of the papillary muscles.  Note LV function is normal.  4 palpitations-she describes palpitations as a brief flutter.  We discussed an Apple Watch to record any rhythm strips and they will purchase.  Schedule echocardiogram to assess LV function.  5 relative hypotension-she describes feelings of weakness and occasional dizziness when her systolic blood pressure is less than 100 and diastolic less than 60.  I have asked her to maintain hydration, increase sodium intake and consider compression hose.  Rogue Pietro, MD

## 2024-04-17 ENCOUNTER — Encounter: Payer: Self-pay | Admitting: Cardiology

## 2024-04-17 ENCOUNTER — Ambulatory Visit: Attending: Cardiology | Admitting: Cardiology

## 2024-04-17 VITALS — BP 136/76 | HR 64 | Ht 69.0 in | Wt 162.0 lb

## 2024-04-17 DIAGNOSIS — Z9889 Other specified postprocedural states: Secondary | ICD-10-CM

## 2024-04-17 NOTE — Patient Instructions (Signed)
 Testing/Procedures: Your physician has requested that you have an echocardiogram. Echocardiography is a painless test that uses sound waves to create images of your heart. It provides your doctor with information about the size and shape of your heart and how well your heart's chambers and valves are working. This procedure takes approximately one hour. There are no restrictions for this procedure. Please do NOT wear cologne, perfume, aftershave, or lotions (deodorant is allowed). Please arrive 15 minutes prior to your appointment time.  Please note: We ask at that you not bring children with you during ultrasound (echo/ vascular) testing. Due to room size and safety concerns, children are not allowed in the ultrasound rooms during exams. Our front office staff cannot provide observation of children in our lobby area while testing is being conducted. An adult accompanying a patient to their appointment will only be allowed in the ultrasound room at the discretion of the ultrasound technician under special circumstances. We apologize for any inconvenience. Location at [facility]  Follow-Up: At [facility], you and your health needs are our priority.  As part of our continuing mission to provide you with exceptional heart care, our providers are all part of one team.  This team includes your primary Cardiologist (physician) and Advanced Practice Providers or APPs (Physician Assistants and Nurse Practitioners) who all work together to provide you with the care you need, when you need it.  Your next appointment:   4 month(s)  Provider:   Redell Shallow, MD- 4 months in [facility]

## 2024-05-02 ENCOUNTER — Encounter: Payer: Self-pay | Admitting: Cardiology

## 2024-05-04 NOTE — Telephone Encounter (Signed)
 Please review msg and attachments, another sent this morning.

## 2024-05-12 ENCOUNTER — Encounter: Payer: Self-pay | Admitting: Cardiology

## 2024-05-14 ENCOUNTER — Other Ambulatory Visit: Payer: Self-pay | Admitting: Cardiology

## 2024-05-14 DIAGNOSIS — Z9889 Other specified postprocedural states: Secondary | ICD-10-CM

## 2024-05-14 DIAGNOSIS — I059 Rheumatic mitral valve disease, unspecified: Secondary | ICD-10-CM

## 2024-05-22 ENCOUNTER — Ambulatory Visit: Payer: Self-pay | Admitting: Cardiology

## 2024-05-22 ENCOUNTER — Ambulatory Visit (HOSPITAL_BASED_OUTPATIENT_CLINIC_OR_DEPARTMENT_OTHER)
Admission: RE | Admit: 2024-05-22 | Discharge: 2024-05-22 | Disposition: A | Discharge status: Home / Self Care | Source: Ambulatory Visit | Attending: Cardiology | Admitting: Cardiology

## 2024-05-22 DIAGNOSIS — I059 Rheumatic mitral valve disease, unspecified: Secondary | ICD-10-CM | POA: Insufficient documentation

## 2024-05-22 DIAGNOSIS — I352 Nonrheumatic aortic (valve) stenosis with insufficiency: Secondary | ICD-10-CM

## 2024-05-22 LAB — ECHOCARDIOGRAM COMPLETE
AR max vel: 2.05 cm2
AV Area VTI: 2.02 cm2
AV Area mean vel: 2.06 cm2
AV Mean grad: 4 mmHg
AV Peak grad: 6.6 mmHg
Ao pk vel: 1.28 m/s
Area-P 1/2: 2.87 cm2
Calc EF: 62.6 %
MV VTI: 1.36 cm2
S' Lateral: 2.9 cm
Single Plane A2C EF: 62.8 %
Single Plane A4C EF: 61.5 %

## 2024-05-22 NOTE — Telephone Encounter (Signed)
 Reviewed EKG sent at 9:27 this AM with Dr Court (DOD) and EKG shows SR with PACs

## 2024-08-01 ENCOUNTER — Encounter: Payer: Self-pay | Admitting: Cardiology

## 2024-08-23 NOTE — Progress Notes (Signed)
 HPI: FU history of near syncope and prior mitral valve repair. She has a history of PVCs as well as mitral valve prolapse status post mitral valve repair.  Holter monitor January 2019 showed sinus rhythm with PACs, brief PAT, PVCs, rare couplet and nonsustained ventricular tachycardia. TEE 2/19 showed normal LV function, severe prolapse of P2 with severe MR, moderate to severe LAE.  Cardiac catheterization May 2019 showed normal coronary arteries and findings consistent with severe mitral regurgitation.  Carotid Dopplers May 2019 showed no significant stenosis.  Patient underwent mitral valve repair on Mar 01, 2018.  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 Follow-up echocardiogram August 2025 showed normal LV function, status post mitral valve repair with mean gradient 4 mmHg and she denies increased dyspnea, chest pain or syncope.  No mitral regurgitation.  Since last seen she continues to have occasional palpitations.  These appear to be associated with PACs and PVCs.  Recent URI.  Current Outpatient Medications  Medication Sig Dispense Refill   aspirin  EC 81 MG EC tablet Take 1 tablet (81 mg total) by mouth daily.     azelastine (ASTELIN) 0.1 % nasal spray Place 1 spray into both nostrils daily as needed for allergies.      cetirizine (ZYRTEC) 10 MG tablet Take 10 mg by mouth daily.     Cholecalciferol (VITAMIN D) 2000 UNITS CAPS Take 2,000 Units by mouth daily.      cyanocobalamin (VITAMIN B12) 1000 MCG tablet Take 1,000 mcg by mouth daily.     estazolam (PROSOM) 2 MG tablet Take 1-2 mg by mouth at bedtime as needed (for sleep).      estradiol  (CLIMARA  - DOSED IN MG/24 HR) 0.05 mg/24hr patch Place 1 patch (0.05 mg total) onto the skin once a week. 4 patch 1   hydrOXYzine (ATARAX/VISTARIL) 25 MG tablet Take 1 tablet by mouth three times daily as needed (Patient not taking: Reported on 04/17/2024)     ketotifen (ZADITOR) 0.025 % ophthalmic solution Place 1 drop  into both eyes 2 (two) times daily as needed (for dry eyes).     levothyroxine  (SYNTHROID , LEVOTHROID) 88 MCG tablet Take 100 mcg by mouth daily before breakfast.      lidocaine  (LIDODERM ) 5 % Apply patch to painful area. Patch may remain in place for up to 12 hours in a 24 hour period.     meloxicam (MOBIC) 15 MG tablet Take 1 tablet by mouth once daily     Multiple Vitamins-Minerals (MULTIVITAL PO) Take 1 tablet by mouth daily.      progesterone  (PROMETRIUM ) 200 MG capsule Take 1 capsule (200 mg total) by mouth daily. 90 capsule 0   sodium chloride  (OCEAN) 0.65 % SOLN nasal spray Place 1-2 sprays into both nostrils as needed for congestion.     traMADol  (ULTRAM ) 50 MG tablet Take 1-2 tablets (50-100 mg total) by mouth every 6 (six) hours as needed for moderate pain. 30 tablet 0   No current facility-administered medications for this visit.     Past Medical History:  Diagnosis Date   Basal cell carcinoma of right lower leg    Dyspnea    all the time   Fibrocystic disease of breast    Hypothyroid    IBS (irritable bowel syndrome)    Mitral regurgitation    MVP (mitral valve prolapse)    Murmur   Pernicious anemia    Rheumatic fever    as child   S/P minimally invasive mitral  valve repair 03/01/2018   Complex valvuloplasty including artificial Gore-tex neochord placement x12 and Sorin Memo Ring Annuloplasty 4DM-32, size 32 via right mini thoracotomy approach   Simple endometrial hyperplasia without atypia    Sleep apnea     Past Surgical History:  Procedure Laterality Date   APPENDECTOMY     LAPAROSCOPIC CHOLECYSTECTOMY  03/2002   MITRAL VALVE REPAIR Right 03/01/2018   Procedure: MINIMALLY INVASIVE MITRAL VALVE REPAIR (MVR);  Surgeon: Dusty Sudie DEL, MD;  Location: [facility] OR;  Service: Open Heart Surgery;  Laterality: Right;  Using LivaNova Memo 4D Size 32 Annuloplasty Ring   RIGHT/LEFT HEART CATH AND CORONARY ANGIOGRAPHY N/A 02/20/2018   Procedure: RIGHT/LEFT HEART CATH AND CORONARY  ANGIOGRAPHY;  Surgeon: Wonda Sharper, MD;  Location: [facility] INVASIVE CV LAB;  Service: Cardiovascular;  Laterality: N/A;   TEE WITHOUT CARDIOVERSION N/A 12/09/2017   Procedure: TRANSESOPHAGEAL ECHOCARDIOGRAM (TEE);  Surgeon: Pietro Redell RAMAN, MD;  Location: [facility] ENDOSCOPY;  Service: Cardiovascular;  Laterality: N/A;   TEE WITHOUT CARDIOVERSION N/A 03/01/2018   Procedure: TRANSESOPHAGEAL ECHOCARDIOGRAM (TEE);  Surgeon: Dusty Sudie DEL, MD;  Location: [facility] OR;  Service: Open Heart Surgery;  Laterality: N/A;   TONSILLECTOMY AND ADENOIDECTOMY      Social History   Socioeconomic History   Marital status: Married    Spouse name: Not on file   Number of children: 2   Years of education: Not on file   Highest education level: Not on file  Occupational History   Not on file  Tobacco Use   Smoking status: Never   Smokeless tobacco: Never  Vaping Use   Vaping status: Never Used  Substance and Sexual Activity   Alcohol use: No   Drug use: No   Sexual activity: Not on file  Other Topics Concern   Not on file  Social History Narrative   Not on file   Social Drivers of Health   Financial Resource Strain: Not on file  Food Insecurity: Low Risk  (07/24/2024)   Received from [facility]   Hunger Vital Sign    Within the past 12 months, you worried that your food would run out before you got money to buy more: Never true    Within the past 12 months, the food you bought just didn't last and you didn't have money to get more. : Never true  Transportation Needs: No Transportation Needs (07/24/2024)   Received from [facility]    In the past 12 months, has lack of reliable transportation kept you from medical appointments, meetings, work or from getting things needed for daily living? : No  Physical Activity: Not on file  Stress: Not on file  Social Connections: Not on file  Intimate Partner Violence: Not on file    Family History  Problem Relation Age of Onset   Diabetes  Father    Heart attack Father    Osteoporosis Mother    Diabetes Brother    Heart failure Brother    Diabetes Paternal Grandmother     ROS: no fevers or chills, productive cough, hemoptysis, dysphasia, odynophagia, melena, hematochezia, dysuria, hematuria, rash, seizure activity, orthopnea, PND, pedal edema, claudication. Remaining systems are negative.  Physical Exam: Well-developed well-nourished in no acute distress.  Skin is warm and dry.  HEENT is normal.  Neck is supple.  Chest is clear to auscultation with normal expansion.  Cardiovascular exam is regular rate and rhythm.  Abdominal exam nontender or distended. No masses palpated. Extremities show no  edema. neuro grossly intact   A/P  1 status post mitral valve repair-most recent echocardiogram showed normally functioning repair.  Continue SBE prophylaxis.  2 history of PVCs/nonsustained ventricular tachycardia-felt possibly secondary to prior mitral valve prolapse/papillary muscle scarring.  LV function is normal.  She remains symptomatic.  Will add Toprol  12.5 mg nightly to see if she tolerates and to see if this improves her symptoms.  3 palpitations-LV function is normal.  We discussed previously a smart watch to record any rhythm strips associated with symptoms.  Redell Shallow, MD

## 2024-08-29 ENCOUNTER — Ambulatory Visit: Attending: Cardiology | Admitting: Cardiology

## 2024-08-29 ENCOUNTER — Encounter: Payer: Self-pay | Admitting: Cardiology

## 2024-08-29 VITALS — BP 132/66 | HR 69 | Ht 69.0 in | Wt 163.0 lb

## 2024-08-29 DIAGNOSIS — Z9889 Other specified postprocedural states: Secondary | ICD-10-CM | POA: Diagnosis not present

## 2024-08-29 DIAGNOSIS — I059 Rheumatic mitral valve disease, unspecified: Secondary | ICD-10-CM

## 2024-08-29 DIAGNOSIS — I349 Nonrheumatic mitral valve disorder, unspecified: Secondary | ICD-10-CM

## 2024-08-29 DIAGNOSIS — R002 Palpitations: Secondary | ICD-10-CM | POA: Diagnosis not present

## 2024-08-29 MED ORDER — METOPROLOL SUCCINATE ER 25 MG PO TB24: 12.5000 mg | ORAL_TABLET | Freq: Two times a day (BID) | ORAL | 3 refills | Status: AC | PRN

## 2024-08-29 NOTE — Patient Instructions (Signed)
 Medication Instructions:   START METOPROLOL  SUC ER 12.5 MG ONCE DAILY AT BEDTIME AS NEEDED FOR PALPITATIONS  *If you need a refill on your cardiac medications before your next appointment, please call your pharmacy*   Follow-Up: At [facility], you and your health needs are our priority.  As part of our continuing mission to provide you with exceptional heart care, our providers are all part of one team.  This team includes your primary Cardiologist (physician) and Advanced Practice Providers or APPs (Physician Assistants and Nurse Practitioners) who all work together to provide you with the care you need, when you need it.  Your next appointment:   6 month(s)  Provider:   Redell Shallow, MD
